# Patient Record
Sex: Male | Born: 1990
Health system: Southern US, Community
[De-identification: ages and names within clinical notes are randomized; demographics above are authoritative.]

## PROBLEM LIST (undated history)

## (undated) DIAGNOSIS — F988 Other specified behavioral and emotional disorders with onset usually occurring in childhood and adolescence: Secondary | ICD-10-CM

## (undated) DIAGNOSIS — C801 Malignant (primary) neoplasm, unspecified: Secondary | ICD-10-CM

## (undated) HISTORY — PX: HERNIA REPAIR: SHX51

---
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
## 2012-05-12 ENCOUNTER — Emergency Department (HOSPITAL_COMMUNITY)
Admission: EM | Admit: 2012-05-12 | Discharge: 2012-05-12 | Disposition: A | Discharge status: Home / Self Care | Payer: 59 | Attending: Emergency Medicine | Admitting: Emergency Medicine

## 2012-05-12 ENCOUNTER — Encounter (HOSPITAL_COMMUNITY): Payer: Self-pay | Admitting: *Deleted

## 2012-05-12 DIAGNOSIS — F17200 Nicotine dependence, unspecified, uncomplicated: Secondary | ICD-10-CM | POA: Insufficient documentation

## 2012-05-12 DIAGNOSIS — R51 Headache: Secondary | ICD-10-CM | POA: Insufficient documentation

## 2012-05-12 DIAGNOSIS — Z8659 Personal history of other mental and behavioral disorders: Secondary | ICD-10-CM | POA: Insufficient documentation

## 2012-05-12 HISTORY — DX: Other specified behavioral and emotional disorders with onset usually occurring in childhood and adolescence: F98.8

## 2012-05-12 MED ORDER — CEPHALEXIN 250 MG PO CAPS: 250.0000 mg | ORAL_CAPSULE | Freq: Four times a day (QID) | ORAL | Status: DC

## 2012-05-12 NOTE — ED Provider Notes (Signed)
History     CSN: [id number]  Arrival date & time 05/12/12  1152   First MD Initiated Contact with Patient 05/12/12 1255      Chief Complaint  Patient presents with  . Headache    (Consider location/radiation/quality/duration/timing/severity/associated sxs/prior treatment) HPI Comments: Mild scalp soreness near crown for a couple days.  No trauma.  No fever or chills.  Patient is a 21 y.o. male presenting with headaches. The history is provided by the patient and the spouse. No language interpreter was used.  Headache  This is a new problem. The current episode started 2 days ago. The problem occurs constantly. The problem has not changed since onset.The headache is associated with nothing. The pain is mild. The pain does not radiate. Pertinent negatives include no fever, no nausea and no vomiting. He has tried nothing for the symptoms.    Past Medical History  Diagnosis Date  . ADD (attention deficit disorder)     History reviewed. No pertinent past surgical history.  No family history on file.  History  Substance Use Topics  . Smoking status: Current Every Day Smoker  . Smokeless tobacco: Not on file  . Alcohol Use: No      Review of Systems  Constitutional: Negative for fever.  Gastrointestinal: Negative for nausea and vomiting.  Neurological: Negative for headaches.  Hematological: Negative for adenopathy.  All other systems reviewed and are negative.    Allergies  Review of patient's allergies indicates no known allergies.  Home Medications   Current Outpatient Rx  Name Route Sig Dispense Refill  . CEPHALEXIN 250 MG PO CAPS Oral Take 1 capsule (250 mg total) by mouth 4 (four) times daily. 28 capsule 0    BP 129/62  Pulse 69  Temp 97.4 F (36.3 C) (Oral)  Resp 16  Ht 6' (1.829 m)  Wt 180 lb (81.647 kg)  BMI 24.41 kg/m2  SpO2 100%  Physical Exam  Nursing note and vitals reviewed. Constitutional: He is oriented to person, place, and time. He  appears well-developed and well-nourished.  HENT:  Head: Normocephalic and atraumatic.    Eyes: EOM are normal.  Neck: Normal range of motion.  Cardiovascular: Normal rate, regular rhythm and intact distal pulses.   Pulmonary/Chest: Effort normal. No respiratory distress.  Abdominal: Soft. He exhibits no distension. There is no tenderness.  Musculoskeletal: Normal range of motion.  Neurological: He is alert and oriented to person, place, and time.  Skin: Skin is warm and dry.  Psychiatric: He has a normal mood and affect. Judgment normal.    ED Course  Procedures (including critical care time)  Labs Reviewed - No data to display No results found.   1. Scalp pain       MDM  rx-keflex 500 mg QID, 27 Warm compresses. F/u with PCP prn.        Evalina Field, [place] 05/12/12 1319

## 2012-05-12 NOTE — ED Notes (Signed)
Intermittent HA pain originating from small papule on top of head.  Sharp pain 8/10 that lasts a few seconds but comes back again about 20 mins. Later.  Has not taken any OTC or Rx pain meds.

## 2012-05-12 NOTE — ED Notes (Signed)
Reports knot on head has been causing sharp pains x 4-5 days.

## 2012-05-12 NOTE — ED Provider Notes (Signed)
Medical screening examination/treatment/procedure(s) were performed by non-physician practitioner and as supervising physician I was immediately available for consultation/collaboration.  Evellyn Tuff, MD 05/12/12 1511 

## 2012-05-12 NOTE — ED Notes (Addendum)
Patient with no complaints at this time. Respirations even and unlabored. Skin warm/dry. Discharge instructions reviewed with patient at this time. Patient given opportunity to voice concerns/ask questions. Patient discharged at this time and left Emergency Department with steady gait.   

## 2013-09-10 ENCOUNTER — Emergency Department (HOSPITAL_COMMUNITY): Payer: Worker's Compensation

## 2013-09-10 ENCOUNTER — Encounter (HOSPITAL_COMMUNITY): Payer: Self-pay | Admitting: Emergency Medicine

## 2013-09-10 ENCOUNTER — Emergency Department (HOSPITAL_COMMUNITY)
Admission: EM | Admit: 2013-09-10 | Discharge: 2013-09-10 | Disposition: A | Discharge status: Home / Self Care | Payer: Worker's Compensation | Attending: Emergency Medicine | Admitting: Emergency Medicine

## 2013-09-10 DIAGNOSIS — S79912A Unspecified injury of left hip, initial encounter: Secondary | ICD-10-CM

## 2013-09-10 DIAGNOSIS — Z87891 Personal history of nicotine dependence: Secondary | ICD-10-CM | POA: Insufficient documentation

## 2013-09-10 DIAGNOSIS — Y9289 Other specified places as the place of occurrence of the external cause: Secondary | ICD-10-CM | POA: Insufficient documentation

## 2013-09-10 DIAGNOSIS — Y9389 Activity, other specified: Secondary | ICD-10-CM | POA: Insufficient documentation

## 2013-09-10 DIAGNOSIS — S79919A Unspecified injury of unspecified hip, initial encounter: Secondary | ICD-10-CM | POA: Insufficient documentation

## 2013-09-10 DIAGNOSIS — S79929A Unspecified injury of unspecified thigh, initial encounter: Principal | ICD-10-CM

## 2013-09-10 DIAGNOSIS — W230XXA Caught, crushed, jammed, or pinched between moving objects, initial encounter: Secondary | ICD-10-CM | POA: Insufficient documentation

## 2013-09-10 DIAGNOSIS — Z8659 Personal history of other mental and behavioral disorders: Secondary | ICD-10-CM | POA: Insufficient documentation

## 2013-09-10 MED ORDER — NAPROXEN 500 MG PO TABS: 500.0000 mg | ORAL_TABLET | Freq: Two times a day (BID) | ORAL | Status: DC

## 2013-09-10 MED ORDER — CYCLOBENZAPRINE HCL 10 MG PO TABS: 10.0000 mg | ORAL_TABLET | Freq: Two times a day (BID) | ORAL | Status: DC | PRN

## 2013-09-10 MED ORDER — HYDROCODONE-ACETAMINOPHEN 5-325 MG PO TABS: 1.0000 | ORAL_TABLET | ORAL | Status: DC | PRN

## 2013-09-10 NOTE — ED Notes (Signed)
Patient transported to CT 

## 2013-09-10 NOTE — ED Notes (Signed)
Pt is here for left hip pain.  Pt states that his foot slipped and got caught between 2 conveyor belts and is leg was pulled away.  Pt now has left hip pain.  No numbness.  This occurred at 1am

## 2013-09-10 NOTE — ED Provider Notes (Signed)
CSN: [id number]     Arrival date & time 09/10/13  0801 History   First MD Initiated Contact with Patient 09/10/13 [phone]     Chief Complaint  Patient presents with  . Hip Pain     (Consider location/radiation/quality/duration/timing/severity/associated sxs/prior Treatment) Patient is a 23 y.o. male presenting with hip pain. The history is provided by the patient.  Hip Pain This is a new problem. The current episode started today. The problem occurs constantly. The problem has been unchanged. The symptoms are aggravated by standing and walking. He has tried nothing for the symptoms.   Terry Huang is a 23 y.o. male who presents to the ED with left hip pain. The pain started approximately 1:30 am when he was working. He stepped onto a work area and his foot got caught between two conveyor belts . He felt his foot twist and felt a pop in his hip. He was holding on to  a bar over his head so he did not fall. A coworker came and helped get his foot out and when he tried to put weight on the foot he felt another pop in his hip and more pain. He denies any other injuries. No nausea, vomiting or other problems.   Past Medical History  Diagnosis Date  . ADD (attention deficit disorder)    History reviewed. No pertinent past surgical history. No family history on file. History  Substance Use Topics  . Smoking status: Former Research scientist (life sciences)  . Smokeless tobacco: Not on file  . Alcohol Use: No    Review of Systems Negative except as stated in HPI   Allergies  Review of patient's allergies indicates no known allergies.  Home Medications   Current Outpatient Rx  Name  Route  Sig  Dispense  Refill  . cephALEXin (KEFLEX) 250 MG capsule   Oral   Take 1 capsule (250 mg total) by mouth 4 (four) times daily.   28 capsule   0    BP 136/77  Pulse 69  Temp(Src) 98.6 F (37 C) (Oral)  Resp 18  SpO2 99% Physical Exam  Nursing note and vitals reviewed. Constitutional: He is oriented to person,  place, and time. He appears well-developed and well-nourished. No distress.  HENT:  Head: Normocephalic and atraumatic.  Eyes: EOM are normal.  Neck: Neck supple.  Cardiovascular: Normal rate and regular rhythm.   Pulmonary/Chest: Effort normal.  Abdominal: Soft. Bowel sounds are normal. There is no tenderness.  Musculoskeletal:       Left hip: He exhibits tenderness.  Pain with flexion of the left hip. Pedal pulses equal, adequate circulation, good touch sensation. Pain with palpation of the lateral aspect of the left hip. Pain with weight bearing.  Neurological: He is alert and oriented to person, place, and time. He has normal strength. No cranial nerve deficit or sensory deficit. Abnormal gait: due to pain.  Skin: Skin is warm and dry.  Psychiatric: He has a normal mood and affect. His behavior is normal.   Ct Hip Left Wo Contrast  09/10/2013   CLINICAL DATA:  Left hip discomfort status post trauma  EXAM: CT OF THE LEFT HIP WITHOUT CONTRAST  TECHNIQUE: Multidetector CT imaging was performed according to the standard protocol. Multiplanar CT image reconstructions were also generated.  COMPARISON:  None.  FINDINGS: The hip joint space is preserved. The femoral head and neck and intertrochanteric regions appear intact. The observed portions of the left hemipelvis also appear normal. The surrounding soft tissues exhibit  no abnormal densities or findings to suggest a hematoma.  IMPRESSION: There is no acute bony abnormality of the left hip. The joint space is preserved. No soft tissue abnormalities are demonstrated either.   Electronically Signed   By: David  Martinique   On: 09/10/2013 09:13    ED Course  Procedures   MDM  23 y.o. male with left hip pain s/p injury at work. Will treat with pain medication and NSAIDS. He will follow up with ortho. I have reviewed this patient's vital signs, nurses notes, appropriate labs and imaging.  I have discussed findings and plan of care with the patient and  his family. They voice understanding.    Medication List    TAKE these medications       cyclobenzaprine 10 MG tablet  Commonly known as:  FLEXERIL  Take 1 tablet (10 mg total) by mouth 2 (two) times daily as needed for muscle spasms.     HYDROcodone-acetaminophen 5-325 MG per tablet  Commonly known as:  NORCO/VICODIN  Take 1 tablet by mouth every 4 (four) hours as needed.     naproxen 500 MG tablet  Commonly known as:  NAPROSYN  Take 1 tablet (500 mg total) by mouth 2 (two) times daily.      ASK your doctor about these medications       cephALEXin 250 MG capsule  Commonly known as:  KEFLEX  Take 1 capsule (250 mg total) by mouth 4 (four) times daily.         [place], [place] 09/10/13 [phone]

## 2013-09-10 NOTE — Discharge Instructions (Signed)
Call Dr. Ruthe Mannan office for a follow up appointment. Apply ice to the area. Do not take the pain medications if you are driving because they will make you sleepy. Return here as needed.

## 2013-09-12 NOTE — ED Provider Notes (Signed)
Medical screening examination/treatment/procedure(s) were conducted as a shared visit with non-physician practitioner(s) and myself.  I personally evaluated the patient during the encounter.  EKG Interpretation   None      Left hip pain after hip abduction at work. Neurovascular intact. Pain with range of motion. CT scan shows no acute anomalies. Refer to orthopedics  Nat Christen, MD 09/12/13 2034

## 2015-10-05 ENCOUNTER — Ambulatory Visit (HOSPITAL_COMMUNITY)
Admission: RE | Admit: 2015-10-05 | Discharge: 2015-10-05 | Disposition: A | Discharge status: Home / Self Care | Payer: 59 | Source: Ambulatory Visit | Attending: Family Medicine | Admitting: Family Medicine

## 2015-10-05 ENCOUNTER — Ambulatory Visit (INDEPENDENT_AMBULATORY_CARE_PROVIDER_SITE_OTHER): Payer: 59 | Admitting: Family Medicine

## 2015-10-05 ENCOUNTER — Telehealth: Payer: Self-pay | Admitting: Family Medicine

## 2015-10-05 ENCOUNTER — Encounter: Payer: Self-pay | Admitting: Family Medicine

## 2015-10-05 VITALS — BP 132/88 | Temp 99.0°F | Ht 71.0 in | Wt 142.0 lb

## 2015-10-05 DIAGNOSIS — R079 Chest pain, unspecified: Secondary | ICD-10-CM | POA: Diagnosis not present

## 2015-10-05 DIAGNOSIS — J111 Influenza due to unidentified influenza virus with other respiratory manifestations: Secondary | ICD-10-CM | POA: Diagnosis not present

## 2015-10-05 MED ORDER — ONDANSETRON 4 MG PO TBDP: 4.0000 mg | ORAL_TABLET | Freq: Three times a day (TID) | ORAL | Status: DC | PRN

## 2015-10-05 MED ORDER — NABUMETONE 500 MG PO TABS
500.0000 mg | ORAL_TABLET | Freq: Two times a day (BID) | ORAL | Status: DC
Start: 2015-10-05 — End: 2016-10-31

## 2015-10-05 MED ORDER — OSELTAMIVIR PHOSPHATE 75 MG PO CAPS: 75.0000 mg | ORAL_CAPSULE | Freq: Two times a day (BID) | ORAL | Status: DC

## 2015-10-05 NOTE — Telephone Encounter (Signed)
Results discussed with patient. Meds sent electronically to pharmacy. Patient verbalized understanding.

## 2015-10-05 NOTE — Telephone Encounter (Signed)
Pt thought he was getting 2-3 scripts, one for inflammation and one for nausea but unsure of exactly what He was supposed to be getting seen today around 2 pm please send them to [facility]

## 2015-10-05 NOTE — Progress Notes (Signed)
   Subjective:    Patient ID: Terry Huang, male    DOB: 01-26-1991, 25 y.o.   MRN: [id number]  Abdominal Pain This is a new problem. The current episode started yesterday. Associated symptoms include diarrhea, a fever, headaches and vomiting. Associated symptoms comments: Sore throat.   Chest and rib pain. Started 2 weeks ago.   Right ribs and left side of chest sore and tend and painful inclu deep breath  Some cough quite a bit  tmax 101.2 , headache whole head aching  Very dizy and nauseated dinm energy  Patient notes chest pain. Right-sided. Sharp in nature. Worse with a deep breath.  Challenges patient has had a prodrome of sorts. A couple weeks worth his symptomatology. Next  Also is had this acute symptomatology develop in the last couple days. Substantial nausea. Also vomiting. Also cough and substantial fever  Review of Systems  Constitutional: Positive for fever.  Gastrointestinal: Positive for vomiting, abdominal pain and diarrhea.  Neurological: Positive for headaches.       Objective:   Physical Exam Alert moderate malaise vital stable positive fever lungs no crackles no wheezes no chest wall pain heart rare rhythm abdomen good bowel sounds no discrete tenderness       Assessment & Plan:  Impression cough fever pleuritic pain acute onset of much along with subacute sent for further assessment chest x-ray was negative therefore felt to be a viral prodrome now followed by acute flu rationale discussed with patient medications prescribed for inflammation flu and nausea 25 minutes spent most in discussion of this somewhat cognitive presentation [facility]

## 2015-10-05 NOTE — Telephone Encounter (Signed)
Xray fine  relafen 500 bid 14 d  tamiflu 75 bid five d  zofr 4 odt 12 one qsix hrs prn

## 2016-10-29 ENCOUNTER — Encounter (HOSPITAL_COMMUNITY): Payer: Self-pay | Admitting: *Deleted

## 2016-10-29 ENCOUNTER — Emergency Department (HOSPITAL_COMMUNITY): Payer: 59

## 2016-10-29 ENCOUNTER — Emergency Department (HOSPITAL_COMMUNITY)
Admission: EM | Admit: 2016-10-29 | Discharge: 2016-10-29 | Disposition: A | Discharge status: Home / Self Care | Payer: 59 | Attending: Emergency Medicine | Admitting: Emergency Medicine

## 2016-10-29 DIAGNOSIS — Y999 Unspecified external cause status: Secondary | ICD-10-CM | POA: Diagnosis not present

## 2016-10-29 DIAGNOSIS — M25511 Pain in right shoulder: Secondary | ICD-10-CM | POA: Diagnosis not present

## 2016-10-29 DIAGNOSIS — Y929 Unspecified place or not applicable: Secondary | ICD-10-CM | POA: Insufficient documentation

## 2016-10-29 DIAGNOSIS — Y939 Activity, unspecified: Secondary | ICD-10-CM | POA: Diagnosis not present

## 2016-10-29 DIAGNOSIS — S42021A Displaced fracture of shaft of right clavicle, initial encounter for closed fracture: Secondary | ICD-10-CM | POA: Insufficient documentation

## 2016-10-29 DIAGNOSIS — S42001A Fracture of unspecified part of right clavicle, initial encounter for closed fracture: Secondary | ICD-10-CM

## 2016-10-29 DIAGNOSIS — Z87891 Personal history of nicotine dependence: Secondary | ICD-10-CM | POA: Insufficient documentation

## 2016-10-29 DIAGNOSIS — S4991XA Unspecified injury of right shoulder and upper arm, initial encounter: Secondary | ICD-10-CM | POA: Diagnosis present

## 2016-10-29 MED ORDER — TRAMADOL HCL 50 MG PO TABS: 50.0000 mg | ORAL_TABLET | Freq: Four times a day (QID) | ORAL | 0 refills | Status: DC | PRN

## 2016-10-29 MED ORDER — ONDANSETRON 4 MG PO TBDP: 4.0000 mg | ORAL_TABLET | Freq: Once | ORAL | Status: DC
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
## 2016-10-29 NOTE — Discharge Instructions (Signed)
Follow up with dr. Harrison next week. °
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
## 2016-10-31 ENCOUNTER — Ambulatory Visit (INDEPENDENT_AMBULATORY_CARE_PROVIDER_SITE_OTHER): Payer: 59 | Admitting: Orthopedic Surgery

## 2016-10-31 ENCOUNTER — Encounter: Payer: Self-pay | Admitting: Orthopedic Surgery

## 2016-10-31 VITALS — BP 122/80 | HR 58 | Ht 71.0 in | Wt 146.0 lb

## 2016-10-31 DIAGNOSIS — S42021A Displaced fracture of shaft of right clavicle, initial encounter for closed fracture: Secondary | ICD-10-CM

## 2016-10-31 MED ORDER — ACETAMINOPHEN-CODEINE #3 300-30 MG PO TABS: 1.0000 | ORAL_TABLET | Freq: Four times a day (QID) | ORAL | 0 refills | Status: DC | PRN

## 2016-10-31 MED ORDER — IBUPROFEN 800 MG PO TABS: 800.0000 mg | ORAL_TABLET | Freq: Three times a day (TID) | ORAL | 1 refills | Status: DC | PRN

## 2016-10-31 NOTE — Patient Instructions (Signed)
Sling x 2 weeks then fig 8 splint   No work for 6 weeks
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
## 2016-11-07 ENCOUNTER — Encounter: Payer: Self-pay | Admitting: Orthopedic Surgery

## 2016-11-07 ENCOUNTER — Telehealth: Payer: Self-pay | Admitting: Orthopedic Surgery

## 2016-11-07 NOTE — Telephone Encounter (Signed)
Note issued accordingly. 

## 2016-11-07 NOTE — Telephone Encounter (Signed)
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  Please advise.

## 2016-11-07 NOTE — Telephone Encounter (Signed)
WHEN HE IS READY

## 2016-11-18 ENCOUNTER — Encounter: Payer: Self-pay | Admitting: Orthopedic Surgery

## 2016-11-18 ENCOUNTER — Ambulatory Visit (INDEPENDENT_AMBULATORY_CARE_PROVIDER_SITE_OTHER): Payer: Self-pay | Admitting: Orthopedic Surgery

## 2016-11-18 DIAGNOSIS — S42021D Displaced fracture of shaft of right clavicle, subsequent encounter for fracture with routine healing: Secondary | ICD-10-CM

## 2016-11-18 NOTE — Progress Notes (Signed)
Fracture care follow-up  Encounter Diagnosis  Name Primary?  . Closed displaced fracture of shaft of right clavicle with routine healing, subsequent encounter Yes    Chief Complaint  Patient presents with  . Follow-up    Recheck on right clavicle fracture. DOI 10-29-16.    The patient wanted his right clavicle fracture checked again he said he felt a rough spot under his skin  On examination he is neurovascularly intact. He has no skin tenting. The fracture site is nontender. Between the fracture and was there is some roughening under the skin which is actually into of the fracture site  Recommend continue current treatment and follow-up for x-rays as previously ordered

## 2016-11-18 NOTE — Patient Instructions (Signed)
KEEP PREVIOUS APPT

## 2016-11-28 ENCOUNTER — Ambulatory Visit: Payer: Self-pay | Admitting: Orthopedic Surgery

## 2016-12-01 ENCOUNTER — Other Ambulatory Visit: Payer: Self-pay | Admitting: Radiology

## 2016-12-01 DIAGNOSIS — S42021D Displaced fracture of shaft of right clavicle, subsequent encounter for fracture with routine healing: Secondary | ICD-10-CM

## 2016-12-02 ENCOUNTER — Ambulatory Visit (INDEPENDENT_AMBULATORY_CARE_PROVIDER_SITE_OTHER): Payer: 59 | Admitting: Orthopedic Surgery

## 2016-12-02 ENCOUNTER — Ambulatory Visit (INDEPENDENT_AMBULATORY_CARE_PROVIDER_SITE_OTHER): Payer: 59

## 2016-12-02 ENCOUNTER — Encounter: Payer: Self-pay | Admitting: Orthopedic Surgery

## 2016-12-02 DIAGNOSIS — S42021D Displaced fracture of shaft of right clavicle, subsequent encounter for fracture with routine healing: Secondary | ICD-10-CM | POA: Diagnosis not present

## 2016-12-02 DIAGNOSIS — S42021A Displaced fracture of shaft of right clavicle, initial encounter for closed fracture: Secondary | ICD-10-CM

## 2016-12-02 MED ORDER — IBUPROFEN 800 MG PO TABS: 800.0000 mg | ORAL_TABLET | Freq: Three times a day (TID) | ORAL | 1 refills | Status: DC | PRN

## 2016-12-02 NOTE — Progress Notes (Signed)
Fracture care follow-up right clavicle fracture  Chief Complaint  Patient presents with  . Follow-up    4 wek recheck on right clavicle fracture, DOI 10-29-16.   5 weeks post injury   Encounter Diagnosis  Name Primary?  . Closed displaced fracture of shaft of right clavicle with routine healing, subsequent encounter Yes   Meds ordered this encounter  Medications  . ibuprofen (ADVIL,MOTRIN) 800 MG tablet    Sig: Take 1 tablet (800 mg total) by mouth every 8 (eight) hours as needed.    Dispense:  90 tablet    Refill:  1     X-ray shows apex dorsal angulation less than 2 cm of shortening and bone contact.  Patient will continue ibuprofen and follow-up for x-rays in

## 2017-01-13 ENCOUNTER — Ambulatory Visit: Payer: 59

## 2017-01-13 ENCOUNTER — Encounter: Payer: Self-pay | Admitting: Orthopedic Surgery

## 2017-01-13 ENCOUNTER — Ambulatory Visit (INDEPENDENT_AMBULATORY_CARE_PROVIDER_SITE_OTHER): Payer: 59

## 2017-01-13 ENCOUNTER — Ambulatory Visit (INDEPENDENT_AMBULATORY_CARE_PROVIDER_SITE_OTHER): Payer: Self-pay | Admitting: Orthopedic Surgery

## 2017-01-13 VITALS — BP 106/70 | HR 66 | Ht 71.0 in | Wt 144.0 lb

## 2017-01-13 DIAGNOSIS — S42021D Displaced fracture of shaft of right clavicle, subsequent encounter for fracture with routine healing: Secondary | ICD-10-CM

## 2017-01-13 NOTE — Progress Notes (Signed)
Follow-up fracture care  Chief Complaint  Patient presents with  . Follow-up    Rt clavicle fracture DOI 10/29/16    11 week recheck    Patient has a right clavicle fracture  We finally see some bridging callus starting to form  He is really asymptomatic  He has normal passive motion without pain  X-ray in 4 weeks continue current work restrictions

## 2017-01-13 NOTE — Patient Instructions (Signed)
Continue work restrictions 4 weeks

## 2017-02-10 ENCOUNTER — Ambulatory Visit (INDEPENDENT_AMBULATORY_CARE_PROVIDER_SITE_OTHER): Payer: 59 | Admitting: Orthopedic Surgery

## 2017-02-10 ENCOUNTER — Ambulatory Visit (INDEPENDENT_AMBULATORY_CARE_PROVIDER_SITE_OTHER): Payer: 59

## 2017-02-10 ENCOUNTER — Encounter: Payer: Self-pay | Admitting: Orthopedic Surgery

## 2017-02-10 DIAGNOSIS — S42021D Displaced fracture of shaft of right clavicle, subsequent encounter for fracture with routine healing: Secondary | ICD-10-CM

## 2017-02-10 NOTE — Progress Notes (Signed)
Fracture care follow-up  Routine office visit  Encounter Diagnosis  Name Primary?  . Closed displaced fracture of shaft of right clavicle with routine healing, subsequent encounter Yes    Large amount of callus over the superior fracture fragment. 37 mm of shortening.  Clinical exam large deformity over the midshaft clavicle no tenderness patient has full range of motion and equal strength in both arms  He can resume full duty no restrictions at work  Released

## 2017-02-10 NOTE — Patient Instructions (Signed)
Note for return to work full duty no restrictions

## 2017-10-23 ENCOUNTER — Encounter: Payer: Self-pay | Admitting: Family Medicine

## 2017-10-23 ENCOUNTER — Ambulatory Visit (INDEPENDENT_AMBULATORY_CARE_PROVIDER_SITE_OTHER): Payer: 59 | Admitting: Family Medicine

## 2017-10-23 VITALS — BP 122/80 | Temp 102.7°F | Ht 71.0 in | Wt 152.0 lb

## 2017-10-23 DIAGNOSIS — J039 Acute tonsillitis, unspecified: Secondary | ICD-10-CM | POA: Diagnosis not present

## 2017-10-23 DIAGNOSIS — I889 Nonspecific lymphadenitis, unspecified: Secondary | ICD-10-CM

## 2017-10-23 MED ORDER — DOXYCYCLINE HYCLATE 100 MG PO TABS: 100.0000 mg | ORAL_TABLET | Freq: Two times a day (BID) | ORAL | 0 refills | Status: DC

## 2017-10-23 NOTE — Progress Notes (Signed)
   Subjective:    Patient ID: Terry Huang, male    DOB: 05-30-1991, 27 y.o.   MRN: [id number]   Sinusitis   This is a new problem. Episode onset: 2 days.  Associated symptoms include coughing, headaches and a sore throat. ( Fever, diarrhea)    No results found for this or any previous visit.   pos throat pain severe in ature  Some cough and ocng    headahe and dizy   Tender sensitibr  Pos sixknes in family    Some mild achiness in joints and muscles     energy not good   apetitie not good   Sat felt achey and hot thn normal     Review of Systems  HENT: Positive for sore throat.   Respiratory: Positive for cough.   Neurological: Positive for headaches.       Objective:   Physical Exam  aAlert active good hydration TMs good pharynx erythematous exact native tonsils exudative, left anterior cervical lymphadenitis lungs clear heart regular rate and rhythm      Assessment & Plan:  Impression purulent tonsillitis with cervical lymphadenitis plan add ibuprofen local measures discussed antibiotics prescribed symptom care discussed

## 2019-01-04 ENCOUNTER — Other Ambulatory Visit: Payer: Self-pay

## 2019-01-04 DIAGNOSIS — Z20822 Contact with and (suspected) exposure to covid-19: Secondary | ICD-10-CM

## 2019-01-06 LAB — NOVEL CORONAVIRUS, NAA: SARS-CoV-2, NAA: NOT DETECTED
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
## 2019-07-23 ENCOUNTER — Ambulatory Visit: Payer: Self-pay

## 2019-07-23 ENCOUNTER — Other Ambulatory Visit: Payer: Self-pay

## 2019-07-23 ENCOUNTER — Ambulatory Visit: Payer: Self-pay | Admitting: Orthopaedic Surgery

## 2019-07-23 ENCOUNTER — Encounter: Payer: Self-pay | Admitting: Orthopaedic Surgery

## 2019-07-23 VITALS — Temp 97.1°F | Ht 71.0 in | Wt 161.0 lb

## 2019-07-23 DIAGNOSIS — M25531 Pain in right wrist: Secondary | ICD-10-CM

## 2019-07-23 DIAGNOSIS — S52501A Unspecified fracture of the lower end of right radius, initial encounter for closed fracture: Secondary | ICD-10-CM

## 2019-07-23 DIAGNOSIS — W19XXXA Unspecified fall, initial encounter: Secondary | ICD-10-CM

## 2019-07-23 NOTE — Progress Notes (Signed)
Patient [id number] Terry Huang, male DOB:[birth year], 29 y.o. [id number]  Chief Complaint  Patient presents with  . Wrist Injury    Right wrist fracture.    HPI  Terry Huang is a 29 y.o. male who fell on New Year's Day and hurt his right dominant wrist.  He has had pain since then.  He has used ice and an Ace Bandage.  It continues to hurt.  It is swollen. He has no redness or numbness.  He has pain with motion of the wrist.  He has no other injury.   Body mass index is 22.45 kg/m.  ROS  Review of Systems  Constitutional: Positive for activity change.  Musculoskeletal: Positive for arthralgias and joint swelling.  All other systems reviewed and are negative.   All other systems reviewed and are negative.  The following is a summary of the past history medically, past history surgically, known current medicines, social history and family history.  This information is gathered electronically by the computer from prior information and documentation.  I review this each visit and have found including this information at this point in the chart is beneficial and informative.    Past Medical History:  Diagnosis Date  . ADD (attention deficit disorder)     Past Surgical History:  Procedure Laterality Date  . HERNIA REPAIR      No family history on file.  Social History Social History   Tobacco Use  . Smoking status: Former Research scientist (life sciences)  . Smokeless tobacco: Never Used  . Tobacco comment: pt smokes e-cigs  Substance Use Topics  . Alcohol use: Yes    Comment: socially   . Drug use: No    No Known Allergies  Current Outpatient Medications  Medication Sig Dispense Refill  . acetaminophen-codeine (TYLENOL #3) 300-30 MG tablet Take 1 tablet by mouth every 6 (six) hours as needed for moderate pain. (Patient not taking: Reported on 01/13/2017) 30 tablet 0  . doxycycline (VIBRA-TABS) 100 MG tablet Take 1 tablet (100 mg total) by mouth 2 (two) times daily. 20 tablet 0  .  ibuprofen (ADVIL,MOTRIN) 800 MG tablet Take 1 tablet (800 mg total) by mouth every 8 (eight) hours as needed. (Patient not taking: Reported on 10/23/2017) 90 tablet 1  . traMADol (ULTRAM) 50 MG tablet Take 1 tablet (50 mg total) by mouth every 6 (six) hours as needed. (Patient not taking: Reported on 01/13/2017) 20 tablet 0   No current facility-administered medications for this visit.     Physical Exam  Temperature (!) 97.1 F (36.2 C), height 5\' 11"  (1.803 m), weight 161 lb (73 kg).  Constitutional: overall normal hygiene, normal nutrition, well developed, normal grooming, normal body habitus. Assistive device:Ace bandage right wrist  Musculoskeletal: gait and station Limp none, muscle tone and strength are normal, no tremors or atrophy is present.  .  Neurological: coordination overall normal.  Deep tendon reflex/nerve stretch intact.  Sensation normal.  Cranial nerves II-XII intact.   Skin:   Normal overall no scars, lesions, ulcers or rashes. No psoriasis.  Psychiatric: Alert and oriented x 3.  Recent memory intact, remote memory unclear.  Normal mood and affect. Well groomed.  Good eye contact.  Cardiovascular: overall no swelling, no varicosities, no edema bilaterally, normal temperatures of the legs and arms, no clubbing, cyanosis and good capillary refill.  Right wrist is very tender, ROM limited secondary to pain, NV intact, more pain dorsal and radial. ROM fingers full but tender. ROM shoulder and neck is  full.  Lymphatic: palpation is normal.  All other systems reviewed and are negative   The patient has been educated about the nature of the problem(s) and counseled on treatment options.  The patient appeared to understand what I have discussed and is in agreement with it.  X-rays were done of the right wrist, reported separately.  Encounter Diagnoses  Name Primary?  . Pain in right wrist Yes  . Traumatic closed nondisplaced fracture of distal end of radius, right,  initial encounter     PLAN Call if any problems.  Precautions discussed.  Continue current medications.   Return to clinic 1 week   A long arm cast is to be applied.  X-rays on return in cast.  Call if any problem.  Precautions discussed.   Electronically Signed Sanjuana Kava, MD 1/5/20219:02 AM

## 2019-07-23 NOTE — Patient Instructions (Signed)
AT work:  No use right arm.  If they cannot accommodate, then no work.

## 2019-07-30 ENCOUNTER — Ambulatory Visit: Payer: Self-pay

## 2019-07-30 ENCOUNTER — Encounter: Payer: Self-pay | Admitting: Orthopaedic Surgery

## 2019-07-30 ENCOUNTER — Other Ambulatory Visit: Payer: Self-pay

## 2019-07-30 ENCOUNTER — Ambulatory Visit (INDEPENDENT_AMBULATORY_CARE_PROVIDER_SITE_OTHER): Payer: Self-pay | Admitting: Orthopaedic Surgery

## 2019-07-30 DIAGNOSIS — S52571D Other intraarticular fracture of lower end of right radius, subsequent encounter for closed fracture with routine healing: Secondary | ICD-10-CM

## 2019-07-30 DIAGNOSIS — S52591D Other fractures of lower end of right radius, subsequent encounter for closed fracture with routine healing: Secondary | ICD-10-CM

## 2019-07-30 NOTE — Progress Notes (Signed)
My wrist is OK  He has done well in the long arm cast this past week on the right arm.  NV intact. Cast is OK.  X-rays were done of the right wrist in cast, reported separately.  Encounter Diagnosis  Name Primary?  . Other closed intra-articular fracture of distal end of right radius with routine healing, subsequent encounter Yes   Return in one week.  X-rays out of cast of the right wrist.  Call if any problem.  Precautions discussed.   Electronically Signed Sanjuana Kava, MD 1/12/20219:00 AM

## 2019-08-06 ENCOUNTER — Ambulatory Visit (INDEPENDENT_AMBULATORY_CARE_PROVIDER_SITE_OTHER): Payer: Self-pay | Admitting: Orthopaedic Surgery

## 2019-08-06 ENCOUNTER — Other Ambulatory Visit: Payer: Self-pay

## 2019-08-06 ENCOUNTER — Encounter: Payer: Self-pay | Admitting: Orthopaedic Surgery

## 2019-08-06 ENCOUNTER — Ambulatory Visit: Payer: Self-pay

## 2019-08-06 DIAGNOSIS — S52571D Other intraarticular fracture of lower end of right radius, subsequent encounter for closed fracture with routine healing: Secondary | ICD-10-CM

## 2019-08-06 NOTE — Progress Notes (Signed)
My wrist is stiff  He has done well with the long arm cast.  It was removed. Skin is OK.  NV intact.  X-rays were done of the right wrist, reported separately.  Encounter Diagnosis  Name Primary?  . Other closed intra-articular fracture of distal end of right radius with routine healing, subsequent encounter Yes   A new short arm cast applied.  Return in three weeks.  X-rays then out of cast.  Call if any problem.  Precautions discussed.   Electronically Signed Sanjuana Kava, MD 1/19/20218:29 AM

## 2019-08-19 ENCOUNTER — Encounter: Payer: Self-pay | Admitting: Family Medicine

## 2019-08-27 ENCOUNTER — Other Ambulatory Visit: Payer: Self-pay

## 2019-08-27 ENCOUNTER — Encounter: Payer: Self-pay | Admitting: Orthopaedic Surgery

## 2019-08-27 ENCOUNTER — Ambulatory Visit (INDEPENDENT_AMBULATORY_CARE_PROVIDER_SITE_OTHER): Payer: Self-pay | Admitting: Orthopaedic Surgery

## 2019-08-27 ENCOUNTER — Ambulatory Visit: Payer: Self-pay

## 2019-08-27 DIAGNOSIS — S52571D Other intraarticular fracture of lower end of right radius, subsequent encounter for closed fracture with routine healing: Secondary | ICD-10-CM

## 2019-08-27 NOTE — Progress Notes (Signed)
My wrist is a little sore  He had his cast removed.  ROM is fair just out of the splint. NV intact.  X-rays were done of the right wrist, reported separately.  Encounter Diagnosis  Name Primary?  . Other closed intra-articular fracture of distal end of right radius with routine healing, subsequent encounter Yes   I have explained about an intra-articular fracture of the right wrist.  It is important he get back full motion.  I have gone over exercises for him to do at home.  Return in two weeks.  X-rays of the right wrist then.  Consider PT if his supination has not returned.  Call if any problem.  Precautions discussed.   Electronically Signed Sanjuana Kava, MD 2/9/20218:38 AM

## 2019-09-10 ENCOUNTER — Ambulatory Visit: Payer: Self-pay | Admitting: Orthopaedic Surgery

## 2019-09-12 ENCOUNTER — Ambulatory Visit: Payer: Self-pay

## 2019-09-12 ENCOUNTER — Encounter: Payer: Self-pay | Admitting: Orthopaedic Surgery

## 2019-09-12 ENCOUNTER — Ambulatory Visit (INDEPENDENT_AMBULATORY_CARE_PROVIDER_SITE_OTHER): Payer: Self-pay | Admitting: Orthopaedic Surgery

## 2019-09-12 ENCOUNTER — Other Ambulatory Visit: Payer: Self-pay

## 2019-09-12 DIAGNOSIS — S52571D Other intraarticular fracture of lower end of right radius, subsequent encounter for closed fracture with routine healing: Secondary | ICD-10-CM

## 2019-09-12 NOTE — Progress Notes (Signed)
I have no pain  He is doing well with the right wrist.  He has very good motion and no pain.  X-rays were done of the right wrist, reported separately.  Encounter Diagnosis  Name Primary?  . Other closed intra-articular fracture of distal end of right radius with routine healing, subsequent encounter Yes   Discharge.  Call if any problem.  Precautions discussed.   Electronically Signed Sanjuana Kava, MD 2/25/202110:31 AM

## 2019-10-06 ENCOUNTER — Encounter (HOSPITAL_COMMUNITY): Payer: Self-pay | Admitting: Emergency Medicine

## 2019-10-06 ENCOUNTER — Other Ambulatory Visit: Payer: Self-pay

## 2019-10-06 ENCOUNTER — Emergency Department (HOSPITAL_COMMUNITY)
Admission: EM | Admit: 2019-10-06 | Discharge: 2019-10-07 | Disposition: A | Discharge status: Other Acute Inpatient Hospital | Payer: Self-pay | Attending: Emergency Medicine | Admitting: Emergency Medicine

## 2019-10-06 DIAGNOSIS — Y929 Unspecified place or not applicable: Secondary | ICD-10-CM | POA: Insufficient documentation

## 2019-10-06 DIAGNOSIS — Y999 Unspecified external cause status: Secondary | ICD-10-CM | POA: Insufficient documentation

## 2019-10-06 DIAGNOSIS — Z7289 Other problems related to lifestyle: Secondary | ICD-10-CM

## 2019-10-06 DIAGNOSIS — Z87891 Personal history of nicotine dependence: Secondary | ICD-10-CM | POA: Insufficient documentation

## 2019-10-06 DIAGNOSIS — S51812A Laceration without foreign body of left forearm, initial encounter: Secondary | ICD-10-CM | POA: Insufficient documentation

## 2019-10-06 DIAGNOSIS — F329 Major depressive disorder, single episode, unspecified: Secondary | ICD-10-CM | POA: Insufficient documentation

## 2019-10-06 DIAGNOSIS — Z20822 Contact with and (suspected) exposure to covid-19: Secondary | ICD-10-CM | POA: Insufficient documentation

## 2019-10-06 DIAGNOSIS — Y939 Activity, unspecified: Secondary | ICD-10-CM | POA: Insufficient documentation

## 2019-10-06 DIAGNOSIS — R45851 Suicidal ideations: Secondary | ICD-10-CM | POA: Insufficient documentation

## 2019-10-06 DIAGNOSIS — F1010 Alcohol abuse, uncomplicated: Secondary | ICD-10-CM | POA: Insufficient documentation

## 2019-10-06 DIAGNOSIS — X781XXA Intentional self-harm by knife, initial encounter: Secondary | ICD-10-CM | POA: Insufficient documentation

## 2019-10-06 DIAGNOSIS — Z23 Encounter for immunization: Secondary | ICD-10-CM | POA: Insufficient documentation

## 2019-10-06 LAB — COMPREHENSIVE METABOLIC PANEL
ALT: 54 U/L — ABNORMAL HIGH (ref 0–44)
AST: 62 U/L — ABNORMAL HIGH (ref 15–41)
Albumin: 4.7 g/dL (ref 3.5–5.0)
Alkaline Phosphatase: 82 U/L (ref 38–126)
Anion gap: 13 (ref 5–15)
BUN: 6 mg/dL (ref 6–20)
CO2: 22 mmol/L (ref 22–32)
Calcium: 8.6 mg/dL — ABNORMAL LOW (ref 8.9–10.3)
Chloride: 100 mmol/L (ref 98–111)
Creatinine, Ser: 0.71 mg/dL (ref 0.61–1.24)
GFR calc Af Amer: >60 mL/min (ref >60)
GFR calc non Af Amer: >60 mL/min (ref >60)
Glucose, Bld: 102 mg/dL — ABNORMAL HIGH (ref 70–99)
Potassium: 3.3 mmol/L — ABNORMAL LOW (ref 3.5–5.1)
Sodium: 135 mmol/L (ref 135–145)
Total Bilirubin: 0.9 mg/dL (ref 0.3–1.2)
Total Protein: 8.3 g/dL — ABNORMAL HIGH (ref 6.5–8.1)

## 2019-10-06 LAB — CBC
HCT: 46.2 % (ref 39.0–52.0)
Hemoglobin: 16.4 g/dL (ref 13.0–17.0)
MCH: 32.7 pg (ref 26.0–34.0)
MCHC: 35.5 g/dL (ref 30.0–36.0)
MCV: 92.2 fL (ref 80.0–100.0)
Platelets: 383 10*3/uL (ref 150–400)
RBC: 5.01 MIL/uL (ref 4.22–5.81)
RDW: 11.3 % — ABNORMAL LOW (ref 11.5–15.5)
WBC: 7.3 10*3/uL (ref 4.0–10.5)
nRBC: 0 % (ref 0.0–0.2)

## 2019-10-06 LAB — RESPIRATORY PANEL BY RT PCR (FLU A&B, COVID)
Influenza A by PCR: NEGATIVE
Influenza B by PCR: NEGATIVE
SARS Coronavirus 2 by RT PCR: NEGATIVE

## 2019-10-06 LAB — RAPID URINE DRUG SCREEN, HOSP PERFORMED
Amphetamines: NOT DETECTED
Barbiturates: NOT DETECTED
Benzodiazepines: NOT DETECTED
Cocaine: NOT DETECTED
Opiates: NOT DETECTED
Tetrahydrocannabinol: NOT DETECTED

## 2019-10-06 LAB — ETHANOL: Alcohol, Ethyl (B): 356 mg/dL (ref <10)

## 2019-10-06 MED ORDER — ALUM & MAG HYDROXIDE-SIMETH 200-200-20 MG/5ML PO SUSP
30.0000 mL | Freq: Once | ORAL | Status: AC
  Administered 2019-10-06: 30 mL via ORAL
  Filled 2019-10-06: qty 30

## 2019-10-06 MED ORDER — TETANUS-DIPHTH-ACELL PERTUSSIS 5-2.5-18.5 LF-MCG/0.5 IM SUSP
0.5000 mL | Freq: Once | INTRAMUSCULAR | Status: AC
  Administered 2019-10-06: 0.5 mL via INTRAMUSCULAR
  Filled 2019-10-06: qty 0.5
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
## 2019-10-06 NOTE — Progress Notes (Signed)
Patient meets criteria for inpatient treatment per Ricky Ala, NP. No appropriate beds at [facility] currently. CSW faxed referrals to the following facilities for review:  [facility]   [facility]   [facility]  [facility]    TTS will continue to seek bed placement.     Darletta Moll MSW, [place] Worker Disposition  [facility] Ph: [phone] Fax: [phone] 10/06/2019 12:48 PM

## 2019-10-06 NOTE — ED Notes (Signed)
Pt informed of recommendation of inpatient placement, bed found at [facility] and can arrive after 4pm tomorrow. Pt verbalized understanding.

## 2019-10-06 NOTE — ED Notes (Signed)
[facility] called and states pt has been accepted to [facility] but can not arrive until 10/07/19 after 1600.

## 2019-10-06 NOTE — Consult Note (Signed)
Telepsych Consultation   Reason for Consult: Deliberate self cutting Referring Physician:  EPD Location of Patient:  [facility]   Location of Provider: [facility]  Patient Identification: Terry Huang MRN:  [id number] Principal Diagnosis: <principal problem not specified> Diagnosis:  Active Problems:   * No active hospital problems. *   Total Time spent with patient: 15 minutes  Subjective:   Terry Huang is a 29 y.o. male was seen via teleassessment.  He presents flat, guarded but pleasant.  Denying suicidal or homicidal ideations.  Denies auditory or visual hallucinations.  Denies previous history with mental illness.  As patient is very minimal throughout this assessment.  Chart reviewed BAL 356 on admission.  As he reports " I do not know" reports he resides by himself.  Denies any supportive person to follow-up with.  States his ex-girlfriend Terry Huang called 911 to have him evaluated.  Chart reviewed multiple stressors listed.  Will recommend inpatient admission.  Case staffed with attending psychiatrist [place].  Support,encouragement and  reassurance was provided.  HPI:  Terry Huang is an 29 y.o. male presenting voluntarily with self-harming behaviors. Patient denied SI, HI and psychosis. Patient reported being on the phone with his girlfriend, stating "I told her I was doing dumb shit and she called the police". Patient admitted to drinking and cutting his wrist. Patient reported the police came to his house and brought him in to the hospital. Patient denied suicide attempt, when asked why patient stated, "its just the mental state I was in, I do damage but not to the point where I want things to end". Patient reported this cutting incident was an isolated event. Patient reported stressors/triggers include, "life, bills, just life". Patient reported grief/loss over fathers death 4 years ago and that he does not talk about it and has not had outpatient grief loss  counseling. During assessment patient minimized incident.   Past Psychiatric History:   Risk to Self: Suicidal Ideation: No Suicidal Intent: (unable to assess) Is patient at risk for suicide?: No Suicidal Plan?: No Access to Means: No What has been your use of drugs/alcohol within the last 12 months?: (alcohol) How many times?: (0) Other Self Harm Risks: (cutting self) Triggers for Past Attempts: (denied) Intentional Self Injurious Behavior: Cutting Comment - Self Injurious Behavior: (cut arm) Risk to Others: Homicidal Ideation: No Thoughts of Harm to Others: No Current Homicidal Intent: No Current Homicidal Plan: No Access to Homicidal Means: No Identified Victim: (n/a) History of harm to others?: No Assessment of Violence: None Noted Violent Behavior Description: (none reported) Does patient have access to weapons?: Yes (Comment)(knives) Criminal Charges Pending?: No Does patient have a court date: No Prior Inpatient Therapy: Prior Inpatient Therapy: No Prior Outpatient Therapy: Prior Outpatient Therapy: No Does patient have an ACCT team?: No Does patient have Intensive In-House Services?  : No Does patient have [facility] services? : No Does patient have P4CC services?: No  Past Medical History:  Past Medical History:  Diagnosis Date  . ADD (attention deficit disorder)     Past Surgical History:  Procedure Laterality Date  . HERNIA REPAIR     Family History: No family history on file. Family Psychiatric  History:  Social History:  Social History   Substance and Sexual Activity  Alcohol Use Yes   Comment: socially      Social History   Substance and Sexual Activity  Drug Use No    Social History   Socioeconomic History  .  Marital status: Single    Spouse name: Not on file  . Number of children: Not on file  . Years of education: Not on file  . Highest education level: Not on file  Occupational History  . Not on file  Tobacco Use  . Smoking status:  Former Research scientist (life sciences)  . Smokeless tobacco: Never Used  . Tobacco comment: pt smokes e-cigs  Substance and Sexual Activity  . Alcohol use: Yes    Comment: socially   . Drug use: No  . Sexual activity: Not on file  Other Topics Concern  . Not on file  Social History Narrative  . Not on file   Social Determinants of Health   Financial Resource Strain:   . Difficulty of Paying Living Expenses:   Food Insecurity:   . Worried About Charity fundraiser in the Last Year:   . Arboriculturist in the Last Year:   Transportation Needs:   . Film/video editor (Medical):   Marland Kitchen Lack of Transportation (Non-Medical):   Physical Activity:   . Days of Exercise per Week:   . Minutes of Exercise per Session:   Stress:   . Feeling of Stress :   Social Connections:   . Frequency of Communication with Friends and Family:   . Frequency of Social Gatherings with Friends and Family:   . Attends Religious Services:   . Active Member of Clubs or Organizations:   . Attends Archivist Meetings:   Marland Kitchen Marital Status:    Additional Social History:    Allergies:  No Known Allergies  Labs:  Results for orders placed or performed during the hospital encounter of 10/06/19 (from the past 48 hour(s))  Rapid urine drug screen (hospital performed)     Status: None   Collection Time: 10/06/19  2:24 AM  Result Value Ref Range   Opiates NONE DETECTED NONE DETECTED   Cocaine NONE DETECTED NONE DETECTED   Benzodiazepines NONE DETECTED NONE DETECTED   Amphetamines NONE DETECTED NONE DETECTED   Tetrahydrocannabinol NONE DETECTED NONE DETECTED   Barbiturates NONE DETECTED NONE DETECTED    Comment: (NOTE) DRUG SCREEN FOR MEDICAL PURPOSES ONLY.  IF CONFIRMATION IS NEEDED FOR ANY PURPOSE, NOTIFY LAB WITHIN 5 DAYS. LOWEST DETECTABLE LIMITS FOR URINE DRUG SCREEN Drug Class                     Cutoff (ng/mL) Amphetamine and metabolites    1000 Barbiturate and metabolites    200 Benzodiazepine                  [id number] Tricyclics and metabolites     300 Opiates and metabolites        300 Cocaine and metabolites        300 THC                            50 Performed at [facility], [address] [place], [place] [address]   Respiratory Panel by RT PCR (Flu A&B, Covid) - Nasopharyngeal Swab     Status: None   Collection Time: 10/06/19  2:24 AM   Specimen: Nasopharyngeal Swab  Result Value Ref Range   SARS Coronavirus 2 by RT PCR NEGATIVE NEGATIVE    Comment: (NOTE) SARS-CoV-2 target nucleic acids are NOT DETECTED. The SARS-CoV-2 RNA is generally detectable in upper respiratoy specimens during the acute phase of infection. The lowest concentration of SARS-CoV-2  viral copies this assay can detect is 131 copies/mL. A negative result does not preclude SARS-Cov-2 infection and should not be used as the sole basis for treatment or other patient management decisions. A negative result may occur with  improper specimen collection/handling, submission of specimen other than nasopharyngeal swab, presence of viral mutation(s) within the areas targeted by this assay, and inadequate number of viral copies (<131 copies/mL). A negative result must be combined with clinical observations, patient history, and epidemiological information. The expected result is Negative. Fact Sheet for Patients:  PinkCheek.be Fact Sheet for Healthcare Providers:  GravelBags.it This test is not yet ap proved or cleared by the [place] FDA and  has been authorized for detection and/or diagnosis of SARS-CoV-2 by FDA under an Emergency Use Authorization (EUA). This EUA will remain  in effect (meaning this test can be used) for the duration of the COVID-19 declaration under Section 564(b)(1) of the Act, 21 U.S.C. section 360bbb-3(b)(1), unless the authorization is terminated or revoked sooner.    Influenza A by PCR NEGATIVE NEGATIVE   Influenza B by PCR NEGATIVE  NEGATIVE    Comment: (NOTE) The Xpert Xpress SARS-CoV-2/FLU/RSV assay is intended as an aid in  the diagnosis of influenza from Nasopharyngeal swab specimens and  should not be used as a sole basis for treatment. Nasal washings and  aspirates are unacceptable for Xpert Xpress SARS-CoV-2/FLU/RSV  testing. Fact Sheet for Patients: PinkCheek.be Fact Sheet for Healthcare Providers: GravelBags.it This test is not yet approved or cleared by the [place] FDA and  has been authorized for detection and/or diagnosis of SARS-CoV-2 by  FDA under an Emergency Use Authorization (EUA). This EUA will remain  in effect (meaning this test can be used) for the duration of the  Covid-19 declaration under Section 564(b)(1) of the Act, 21  U.S.C. section 360bbb-3(b)(1), unless the authorization is  terminated or revoked. Performed at [facility], [address], [place], [place] [address]   CBC     Status: Abnormal   Collection Time: 10/06/19  2:28 AM  Result Value Ref Range   WBC 7.3 4.0 - 10.5 K/uL   RBC 5.01 4.22 - 5.81 MIL/uL   Hemoglobin 16.4 13.0 - 17.0 g/dL   HCT 46.2 39.0 - 52.0 %   MCV 92.2 80.0 - 100.0 fL   MCH 32.7 26.0 - 34.0 pg   MCHC 35.5 30.0 - 36.0 g/dL   RDW 11.3 (L) 11.5 - 15.5 %   Platelets 383 150 - 400 K/uL   nRBC 0.0 0.0 - 0.2 %    Comment: Performed at [facility] [place], [address], [place], [place] [address]  Comprehensive metabolic panel     Status: Abnormal   Collection Time: 10/06/19  2:28 AM  Result Value Ref Range   Sodium 135 135 - 145 mmol/L   Potassium 3.3 (L) 3.5 - 5.1 mmol/L   Chloride 100 98 - 111 mmol/L   CO2 22 22 - 32 mmol/L   Glucose, Bld 102 (H) 70 - 99 mg/dL    Comment: Glucose reference range applies only to samples taken after fasting for at least 8 hours.   BUN 6 6 - 20 mg/dL   Creatinine, Ser 0.71 0.61 - 1.24 mg/dL   Calcium 8.6 (L) 8.9 - 10.3 mg/dL   Total Protein 8.3 (H) 6.5 - 8.1 g/dL    Albumin 4.7 3.5 - 5.0 g/dL   AST 62 (H) 15 - 41 U/L   ALT 54 (H) 0 - 44  U/L   Alkaline Phosphatase 82 38 - 126 U/L   Total Bilirubin 0.9 0.3 - 1.2 mg/dL   GFR calc non Af Amer >60 >60 mL/min   GFR calc Af Amer >60 >60 mL/min   Anion gap 13 5 - 15    Comment: Performed at [facility], [address], [place], [place] [address]  Ethanol     Status: Abnormal   Collection Time: 10/06/19  2:28 AM  Result Value Ref Range   Alcohol, Ethyl (B) 356 (HH) <10 mg/dL    Comment: CRITICAL RESULT CALLED TO, READ BACK BY AND VERIFIED WITH: WALKER,T @ [id number] ON 10/06/19 BY JUW Performed at [facility], [address], [place], [place] [address]     Medications:  No current facility-administered medications for this encounter.   No current outpatient medications on file.    Musculoskeletal:   Psychiatric Specialty Exam: Physical Exam  Review of Systems  Blood pressure 129/84, pulse 73, temperature 98.1 F (36.7 C), temperature source Oral, resp. rate 14, height 6' (1.829 m), weight 77.1 kg, SpO2 98 %.Body mass index is 23.06 kg/m.  General Appearance: Casual  Eye Contact:  Fair  Speech:  Clear and Coherent  Volume:  Normal  Mood:  Anxious and Depressed  Affect:  Depressed and Flat  Thought Process:  Coherent  Orientation:  Full (Time, Place, and Person)  Thought Content:  Logical  Suicidal Thoughts:  No  Homicidal Thoughts:  No  Memory:  Immediate;   Fair Recent;   Fair  Judgement:  Fair  Insight:  Fair  Psychomotor Activity:  Normal  Concentration:  Concentration: Fair  Recall:  [employer] of Knowledge:  Fair  Language:  Fair  Akathisia:  No  Handed:  Right  AIMS (if indicated):     Assets:  Communication Skills Desire for Improvement Resilience Social Support  ADL's:  Intact  Cognition:  WNL  Sleep:        Treatment Plan Summary: Daily contact with patient to assess and evaluate symptoms and progress in treatment and Medication management  Disposition: Recommend  psychiatric Inpatient admission when medically cleared. - TTS seeking placement   This service was provided via telemedicine using a 2-way, interactive audio and video technology.  Names of all persons participating in this telemedicine service and their role in this encounter. Name: Lavone Lauture  Role: patient   Name: T.Jamae Tison Role: NP           Derrill [place], NP 10/06/2019 12:32 PM

## 2019-10-06 NOTE — Progress Notes (Signed)
Pt accepted to [facility]  Dr. Elaina Hoops is the accepting provider.   Call report to [phone] Nira Conn, RN @ [facility] ED notified.   Pt is  Voluntary Pt may be transported by [facility] Pt scheduled to arrive to the [place] unit after 4pm on Monday 10/07/19.    Darletta Moll MSW, [place] Worker Disposition  [facility] Ph: [phone] Fax: [phone]  10/06/2019 1:08 PM

## 2019-10-06 NOTE — ED Notes (Signed)
Pt calm and cooperative at this time. Pt denies any needs.

## 2019-10-06 NOTE — BH Assessment (Signed)
Tele Assessment Note   Patient Name: Terry Huang MRN: [id number] Referring Physician: Dr. Joseph Berkshire Location of Patient: [facility] Location of Provider: [place] is an 29 y.o. male presenting voluntarily with self-harming behaviors. Patient denied SI, HI and psychosis. Patient reported being on the phone with his girlfriend, stating "I told her I was doing dumb shit and she called the police". Patient admitted to drinking and cutting his wrist. Patient reported the police came to his house and brought him in to the hospital. Patient denied suicide attempt, when asked why patient stated, "its just the mental state I was in, I do damage but not to the point where I want things to end". Patient reported this cutting incident was an isolated event. Patient reported stressors/triggers include, "life, bills, just life". Patient reported grief/loss over fathers death 4 years ago and that he does not talk about it and has not had outpatient grief loss counseling. During assessment patient minimized incident.   Patient reported not receiving no current outpatient mental health services. Patient denied prior inpatient mental health treatment, and suicidal attempts. Patient reported he is not on any psych medications.   Patient reported living alone. Patient reported main support system is his brother and his male friend. 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 Patient reported money is a stressor. Patient was calm and pleasant during assessment.   BAL 356.   Diagnosis: Alcohol Use, recurrent  Past Medical History:  Past Medical History:  Diagnosis Date  . ADD (attention deficit disorder)     Past Surgical History:  Procedure Laterality Date  . HERNIA REPAIR      Family History: No family history on file.  Social History:  reports that he has quit smoking. He has never used smokeless tobacco. He reports current alcohol use. He  reports that he does not use drugs.  Additional Social History:  Alcohol / Drug Use Pain Medications: see MAR Prescriptions: see MAR Over the Counter: see MAR  CIWA:   COWS:    Allergies: No Known Allergies  Home Medications: (Not in a hospital admission)   OB/GYN Status:  No LMP for male patient.  General Assessment Data Location of Assessment: [facility] TTS Assessment: In system Is this a Tele or Face-to-Face Assessment?: Tele Assessment Is this an Initial Assessment or a Re-assessment for this encounter?: Initial Assessment Patient Accompanied by:: N/A Language Other than English: No Living Arrangements: Other (Comment)(home alone) What gender do you identify as?: Male Marital status: Single Living Arrangements: Alone Can pt return to current living arrangement?: Yes Admission Status: Voluntary Is patient capable of signing voluntary admission?: Yes Referral Source: Self/Family/Friend  Crisis Care Plan Living Arrangements: Alone Legal Guardian: Other:(self) Name of Psychiatrist: (none) Name of Therapist: (none)  Education Status Is patient currently in school?: No Is the patient employed, unemployed or receiving disability?: Employed  Risk to self with the past 6 months Suicidal Ideation: No Has patient been a risk to self within the past 6 months prior to admission? : No Suicidal Intent: (unable to assess) Has patient had any suicidal intent within the past 6 months prior to admission? : No Is patient at risk for suicide?: No Suicidal Plan?: No Has patient had any suicidal plan within the past 6 months prior to admission? : No Access to Means: No What has been your use of drugs/alcohol within the last 12 months?: (alcohol) Previous Attempts/Gestures: No How many times?: (0) Other Self  Harm Risks: (cutting self) Triggers for Past Attempts: (denied) Intentional Self Injurious Behavior: Cutting Comment - Self Injurious Behavior: (cut arm) Family Suicide  History: No Recent stressful life event(s): Conflict (Comment), Other (Comment)(relationship conflict with ex-girlfriend) Persecutory voices/beliefs?: No Depression: No Depression Symptoms: (denied) Substance abuse history and/or treatment for substance abuse?: No Suicide prevention information given to non-admitted patients: Not applicable  Risk to Others within the past 6 months Homicidal Ideation: No Does patient have any lifetime risk of violence toward others beyond the six months prior to admission? : No Thoughts of Harm to Others: No Current Homicidal Intent: No Current Homicidal Plan: No Access to Homicidal Means: No Identified Victim: (n/a) History of harm to others?: No Assessment of Violence: None Noted Violent Behavior Description: (none reported) Does patient have access to weapons?: Yes (Comment)(knives) Criminal Charges Pending?: No Does patient have a court date: No Is patient on probation?: No  Psychosis Hallucinations: None noted Delusions: None noted  Mental Status Report Appearance/Hygiene: Unremarkable Eye Contact: Good Motor Activity: Freedom of movement Speech: Logical/coherent Level of Consciousness: Alert Mood: Pleasant Affect: Appropriate to circumstance Anxiety Level: Minimal Thought Processes: Coherent, Relevant Judgement: Impaired Orientation: Person, Place, Time, Situation Obsessive Compulsive Thoughts/Behaviors: None  Cognitive Functioning Concentration: Fair Memory: Recent Intact Is patient IDD: No Insight: Poor Impulse Control: Poor Appetite: Fair Have you had any weight changes? : No Change Sleep: Decreased Total Hours of Sleep: (4-5) Vegetative Symptoms: None  ADLScreening [facility] Assessment Services) Patient's cognitive ability adequate to safely complete daily activities?: Yes Patient able to express need for assistance with ADLs?: Yes Independently performs ADLs?: Yes (appropriate for developmental age)  Prior Inpatient  Therapy Prior Inpatient Therapy: No  Prior Outpatient Therapy Prior Outpatient Therapy: No Does patient have an ACCT team?: No Does patient have Intensive In-House Services?  : No Does patient have [facility] services? : No Does patient have P4CC services?: No  ADL Screening (condition at time of admission) Patient's cognitive ability adequate to safely complete daily activities?: Yes Patient able to express need for assistance with ADLs?: Yes Independently performs ADLs?: Yes (appropriate for developmental age)  Regulatory affairs officer (For Healthcare) Does Patient Have a Medical Advance Directive?: No Would patient like information on creating a medical advance directive?: No - Patient declined  Disposition:  Disposition Initial Assessment Completed for this Encounter: Yes  Lindon Romp, NP, recommends overnight observation for safety and stabilization with psych reassessment in the AM. Patient BAL 356 at 0307 10/06/19.  This service was provided via telemedicine using a 2-way, interactive audio and video technology.  Names of all persons participating in this telemedicine service and their role in this encounter. Name: Luetta Nutting Role: Patient  Name: Kirtland Bouchard Role: TTS Clinician  Name:  Role:   Name:  Role:     Venora Maples 10/06/2019 4:40 AM

## 2019-10-06 NOTE — ED Notes (Signed)
Date and time results received: 10/06/19 0307  Test: ETOH Critical Value: 356  Name of Provider Notified: Betsey Holiday, MD  Orders Received? Or Actions Taken?: acknowledged

## 2019-10-06 NOTE — ED Triage Notes (Signed)
Pt here tonight after cutting his left arm with a knife. Pt admits to hx of same. Pt denies SI/HI.

## 2019-10-07 NOTE — ED Provider Notes (Signed)
Clinical Course as of Oct 07 1307  Sun Oct 06, 2019  0712 Pt signed out to me by Dr Betsey Holiday.  Briefly 29 yo male who is here voluntarily after self-induced superficial lacerations to forearms in the setting of significant etoh consumption.  He's intoxicated on exam, sleeping, otherwise hemodynamically stable.  Wounds are largely superficial on forearms, overnight EDP did not feel these needed sutures.  Plan to monitor for improving sobriety.  Patient is here voluntarily and denied SI/HI, reports he has done this kind of behavior in the past   [MT]  1145 Awake, arousable, awaiting [facility] reassessment   [MT]    Clinical Course User Index [MT] Anorah Trias, Carola Rhine, MD      Wyvonnia Dusky, MD 10/07/19 [phone]

## 2019-10-07 NOTE — ED Notes (Signed)
Able to Engineer, site for transport to [facility].

## 2019-10-07 NOTE — ED Notes (Signed)
Received report from previous RN, pt lying supine in bed, alert, able to answer questions, sitter remains at bedside, update given

## 2019-10-07 NOTE — ED Notes (Signed)
Report given to Kennyth Lose, Therapist, sports at [facility].

## 2019-10-07 NOTE — ED Notes (Signed)
Staff at [facility] not available to take report at this time. Nursing staff to call us back for report. I verified pt's room assignment for transport service when they arrive here.

## 2020-03-05 ENCOUNTER — Ambulatory Visit: Payer: Self-pay | Admitting: Family Medicine

## 2020-03-11 ENCOUNTER — Telehealth: Payer: Self-pay | Admitting: Family Medicine

## 2020-03-11 NOTE — Telephone Encounter (Signed)
Pt has not been seen in this office since 2019 and Pt has appt with Dr Lovena Le 08/19 NO show  This Pt does not have a provider since he has been so long being seen. Pt needs appt do I make him one or tell him to find a new provider?   Pt call back [phone]

## 2020-03-11 NOTE — Telephone Encounter (Signed)
Yes, pls find out what he's being seen for?  Acute issue or just a physical/well visit.   If acute issue, then schedule sooner with karen. If can wait then give next physical appt/est care appt.   Thx.   Dr. Lovena Le

## 2020-03-11 NOTE — Telephone Encounter (Signed)
Pt scheduled 8/26 with Santiago Glad

## 2020-03-12 ENCOUNTER — Encounter: Payer: Self-pay | Admitting: Family Medicine

## 2020-03-12 ENCOUNTER — Other Ambulatory Visit: Payer: Self-pay

## 2020-03-12 ENCOUNTER — Ambulatory Visit (INDEPENDENT_AMBULATORY_CARE_PROVIDER_SITE_OTHER): Payer: Self-pay | Admitting: Family Medicine

## 2020-03-12 VITALS — BP 130/82 | HR 73 | Temp 97.0°F | Wt 160.2 lb

## 2020-03-12 DIAGNOSIS — R42 Dizziness and giddiness: Secondary | ICD-10-CM

## 2020-03-12 NOTE — Progress Notes (Signed)
Patient ID: Terry Huang, male    DOB: 1990-10-12, 29 y.o.   MRN: [id number]   Chief Complaint  Patient presents with  . nausea and dizziness    1 1/2 to 2 weeks- comes and goes- also will feel hot- started with a fever 1 1/2 weeks ago of 101- but no fevers now   Subjective:    HPI 2 weeks ago started feeling nausea and dizziness. Was sent home from work once for fever of 101.0.. No fever since. Nausea has resolved.  Medical History Terry Huang has a past medical history of ADD (attention deficit disorder).   Outpatient Encounter Medications as of 03/12/2020  Medication Sig  . omeprazole (PRILOSEC) 20 MG capsule Take 20 mg by mouth daily as needed. (Patient not taking: Reported on 03/12/2020)   No facility-administered encounter medications on file as of 03/12/2020.     Review of Systems  Constitutional: Positive for fever.       Once.  HENT: Negative.   Eyes: Negative.   Respiratory: Negative.   Cardiovascular: Negative.   Gastrointestinal: Negative.   Skin: Negative.   Neurological: Positive for dizziness and light-headedness.       Occasionally with position changes.  Hematological: Negative.      Vitals BP 130/82   Pulse 73   Temp (!) 97 F (36.1 C) (Oral)   Wt 160 lb 3.2 oz (72.7 kg)   SpO2 97%   BMI 21.73 kg/m   Objective:   Physical Exam Vitals and nursing note reviewed.  Constitutional:      Appearance: Normal appearance.  HENT:     Right Ear: Tympanic membrane normal.     Left Ear: Tympanic membrane normal.  Cardiovascular:     Rate and Rhythm: Normal rate and regular rhythm.     Pulses: Normal pulses.     Heart sounds: Normal heart sounds.  Pulmonary:     Effort: Pulmonary effort is normal.     Breath sounds: Normal breath sounds.  Skin:    General: Skin is warm and dry.  Neurological:     Mental Status: He is alert and oriented to person, place, and time.  Psychiatric:        Mood and Affect: Mood normal.        Behavior: Behavior normal.       Assessment and Plan   1. Dizziness - Basic metabolic panel   Terry Huang presents today after a 2 week period where he was feeling "dizzy" and nauseated. He works in a [employer] and he doesn't have A/C at home. He reports drinking 10+ bottles of water every day.  Terry Huang needs a return to work note for Monday. With all the water he drinks, I want to make sure he isn't having low sodium levels.  He feels like he has mostly returned to normal but occassionally feels "light-heated" with position changes. He will get a BMP checked today when he leaves and he will add electrolyte packets to a couple of bottles of water per day.   Orthostatic vitals in the office-- negative for postural hypotension: lying: 130/90-79; sitting: 142/92- 80; standing 138/86-85.   Agrees with plan of care discussed today. Understands warning signs to seek further care: increased nausea and dizziness. Any other symptoms that make you worried that something is wrong. Understands to follow-up if symptoms do not improve or if anything changes.  Will notify you of BMP results once they are available.   Chalmers Guest, NP  03/12/2020  

## 2020-03-12 NOTE — Progress Notes (Signed)
   Patient ID: Terry Huang, male    DOB: 1991/03/08, 29 y.o.   MRN: [id number]   Chief Complaint  Patient presents with  . nausea and dizziness    1 1/2 to 2 weeks- comes and goes- also will feel hot- started with a fever 1 1/2 weeks ago of 101- but no fevers now   Subjective:    HPI   Medical History Terry Huang has a past medical history of ADD (attention deficit disorder).   Outpatient Encounter Medications as of 03/12/2020  Medication Sig  . omeprazole (PRILOSEC) 20 MG capsule Take 20 mg by mouth daily as needed. (Patient not taking: Reported on 03/12/2020)   No facility-administered encounter medications on file as of 03/12/2020.     Review of Systems   Vitals BP 130/82   Pulse 73   Temp (!) 97 F (36.1 C) (Oral)   Wt 160 lb 3.2 oz (72.7 kg)   SpO2 97%   BMI 21.73 kg/m   Objective:   Physical Exam   Assessment and Plan   There are no diagnoses linked to this encounter.

## 2020-03-12 NOTE — Patient Instructions (Signed)
Preventing Heat Exhaustion, Adult Heat exhaustion happens when your body gets too hot (overheated) from hot weather or from exercise. Untreated heat exhaustion could lead to heat stroke. Heat stroke can be deadly. How can heat exhaustion affect me? Early warning signs of heat exhaustion are:  Weakness.  Fatigue.  Stomach cramps.  Arm pain.  Leg cramps. Later symptoms of heat exhaustion include:  Heavy sweating.  Clammy skin.  Rapid, weak pulse.  Nausea or vomiting.  Dizziness.  Headache.  Fainting. If you have signs and symptoms of heat exhaustion, move to a cool place, loosen your clothing, and drink water or a sports drink. Then, put cool, wet compresses on your body or get into a cool bath or shower. What can increase my risk? People who work or exercise outside in hot weather have the highest risk of heat exhaustion. You may also be at higher risk if you:  Are over age 60. Older adults have a greater risk for heat exhaustion than younger adults.  Are overweight.  Have high blood pressure.  Have heart disease. What actions can I take to prevent heat exhaustion?      Avoid being outside on very hot days. Check your local news for extreme heat alerts or warnings.  In extreme heat, stay in an air-conditioned environment until the temperature cools off.  Check with your health care provider before starting any new exercise or activity. Ask about any health conditions or medicines that might increase your risk for heat exhaustion.  Wear lightweight, light-colored, and loose-fitting clothing in warm weather.  Do outdoor activities when it is cooler. This may be in the morning, late afternoon, or evening. Take breaks in the shade.  Do not work or exercise in the heat when you feel unwell or have been sick.  Start any new work or exercise activity gradually.  Protect yourself from the sun by wearing a broad-brimmed hat and using at least SPF 15 broad-spectrum  sunscreen.  Drink enough water or sports drink to keep your urine pale yellow. When it is hot, drink every 15 to 20 minutes, even if you are not thirsty.  Do not go out in the heat after a heavy meal.  Do not drink alcohol or caffeinated drinks when it is very hot outside.  If you have friends or family members who are older adults: ? Do not leave an older adult alone in a hot car. ? Make sure older adults have access to air-conditioning on very hot days. Remind them to drink enough fluids. Check on them at least twice a day if you can. Where to find more information  Centers for Disease Control and Prevention (CDC): PurpleGadgets.be  American Academy of Family Physicians (AAFP): https://familydoctor.org/condition/heat-exhaustion-heatstroke  Optometrist (AAOS): https://orthoinfo.aaos.org/en/diseases--conditions/heat-injury-and-heat-exhaustion Contact a health care provider if:  You faint.  You feel weak or dizzy.  You have any signs or symptoms of heat exhaustion that last more than one hour. Get help right away if you have signs of heat stroke:  Body temperature of 103F (39.4C) or higher.  Hot, dry, red skin.  Fast, thumping pulse.  Confusion.  Loss of consciousness. Summary  Heat exhaustion happens when your body gets overheated and cannot cool down.  Avoid being outside on very hot days. Check your local news for extreme heat alerts or warnings. When you are out in the heat, take steps to protect yourself from the sun and stay hydrated.  If you have signs or symptoms of heat  exhaustion, get out of the heat, drink fluids, take steps to cool down, and contact a health care provider.  Get help right away if you have signs or symptoms of heat stroke. This information is not intended to replace advice given to you by your health care provider. Make sure you discuss any questions you have with your health  care provider. Document Revised: 03/29/2019 Document Reviewed: 10/11/2017 Elsevier Patient Education  [employer].

## 2020-07-01 ENCOUNTER — Encounter: Payer: Self-pay | Admitting: Emergency Medicine

## 2020-07-01 ENCOUNTER — Ambulatory Visit
Admission: EM | Admit: 2020-07-01 | Discharge: 2020-07-01 | Disposition: A | Discharge status: Home / Self Care | Payer: Self-pay | Attending: Physician Assistant | Admitting: Physician Assistant

## 2020-07-01 ENCOUNTER — Other Ambulatory Visit: Payer: Self-pay

## 2020-07-01 ENCOUNTER — Ambulatory Visit: Payer: Self-pay | Admitting: Family Medicine

## 2020-07-01 DIAGNOSIS — Z1152 Encounter for screening for COVID-19: Secondary | ICD-10-CM

## 2020-07-01 DIAGNOSIS — J069 Acute upper respiratory infection, unspecified: Secondary | ICD-10-CM

## 2020-07-01 NOTE — ED Triage Notes (Signed)
Pt had sore throat that started on Friday. That has resolved but now pt is having headache that started today, mild nausea and feels hot.

## 2020-07-01 NOTE — Discharge Instructions (Signed)
Your covid and influenza test are pending

## 2020-07-03 LAB — COVID-19, FLU A+B NAA
Influenza A, NAA: NOT DETECTED
Influenza B, NAA: NOT DETECTED
SARS-CoV-2, NAA: NOT DETECTED

## 2020-07-05 NOTE — ED Provider Notes (Signed)
[facility]    CSN: [id number] Arrival date & time: 07/01/20  1533      History   Chief Complaint No chief complaint on file.   HPI Terry Huang is a 29 y.o. male.   The history is provided by the patient. No language interpreter was used.  Cough Cough characteristics:  Non-productive Sputum characteristics:  Nondescript Severity:  Mild Onset quality:  Gradual Timing:  Constant Progression:  Worsening Chronicity:  New Smoker: no   Context: upper respiratory infection   Relieved by:  Nothing Worsened by:  Nothing Ineffective treatments:  None tried Associated symptoms: no shortness of breath     Past Medical History:  Diagnosis Date   ADD (attention deficit disorder)     There are no problems to display for this patient.   Past Surgical History:  Procedure Laterality Date   HERNIA REPAIR         Home Medications    Prior to Admission medications   Medication Sig Start Date End Date Taking? Authorizing Provider  omeprazole (PRILOSEC) 20 MG capsule Take 20 mg by mouth daily as needed. Patient not taking: Reported on 03/12/2020    [provider]    Family History No family history on file.  Social History Social History   Tobacco Use   Smoking status: Former Smoker   Smokeless tobacco: Never Used   Tobacco comment: pt smokes Surveyor, quantity Use: Every day  Substance Use Topics   Alcohol use: Yes    Comment: socially    Drug use: No     Allergies   Patient has no known allergies.   Review of Systems Review of Systems  Respiratory: Positive for cough. Negative for shortness of breath.   All other systems reviewed and are negative.    Physical Exam Triage Vital Signs ED Triage Vitals  Enc Vitals Group     BP 07/01/20 1619 (!) 157/97     Pulse Rate 07/01/20 1619 84     Resp 07/01/20 1619 18     Temp 07/01/20 1619 99.3 F (37.4 C)     Temp Source 07/01/20 1619 Oral     SpO2 07/01/20  1619 97 %     Weight --      Height 07/01/20 1621 6' (1.829 m)     Head Circumference --      Peak Flow --      Pain Score 07/01/20 1621 0     Pain Loc --      Pain Edu? --      Excl. in [place]? --    No data found.  Updated Vital Signs BP (!) 157/97 (BP Location: Right Arm)    Pulse 84    Temp 99.3 F (37.4 C) (Oral)    Resp 18    Ht 6' (1.829 m)    SpO2 97%    BMI 21.73 kg/m   Visual Acuity Right Eye Distance:   Left Eye Distance:   Bilateral Distance:    Right Eye Near:   Left Eye Near:    Bilateral Near:     Physical Exam Vitals and nursing note reviewed.  Constitutional:      Appearance: He is well-developed and well-nourished.  HENT:     Head: Normocephalic and atraumatic.  Eyes:     Conjunctiva/sclera: Conjunctivae normal.  Cardiovascular:     Rate and Rhythm: Normal rate and regular rhythm.     Heart sounds: No murmur  heard.   Pulmonary:     Effort: Pulmonary effort is normal. No respiratory distress.     Breath sounds: Normal breath sounds.  Abdominal:     Palpations: Abdomen is soft.  Musculoskeletal:        General: No edema.     Cervical back: Neck supple.  Skin:    General: Skin is warm and dry.  Neurological:     General: No focal deficit present.     Mental Status: He is alert.  Psychiatric:        Mood and Affect: Mood and affect and mood normal.      UC Treatments / Results  Labs (all labs ordered are listed, but only abnormal results are displayed) Labs Reviewed  COVID-19, FLU A+B NAA   Narrative:    Test(s) 140142-Influenza A, NAA; 140143-Influenza B, NAA was developed and its performance characteristics determined by [facility]. It has not been cleared or approved by the Food and Drug Administration. Performed at:  [address] [place], [place]  [id number] Lab Director: Rush Farmer MD, Phone:  [phone]    EKG   Radiology No results found.  Procedures Procedures (including critical care  time)  Medications Ordered in UC Medications - No data to display  Initial Impression / Assessment and Plan / UC Course  I have reviewed the triage vital signs and the nursing notes.  Pertinent labs & imaging results that were available during my care of the patient were reviewed by me and considered in my medical decision making (see chart for details).     Mdm: Covid and influenza pending.  Final Clinical Impressions(s) / UC Diagnoses   Final diagnoses:  Encounter for screening for COVID-19  Acute upper respiratory infection     Discharge Instructions     Your covid and influenza test are pending   ED Prescriptions    None     PDMP not reviewed this encounter.  An After Visit Summary was printed and given to the patient.    Fransico Meadow, [place] 07/05/20 1644

## 2021-06-08 ENCOUNTER — Other Ambulatory Visit: Payer: Self-pay

## 2021-06-08 ENCOUNTER — Encounter: Payer: Self-pay | Admitting: Nurse Practitioner

## 2021-06-08 ENCOUNTER — Ambulatory Visit (INDEPENDENT_AMBULATORY_CARE_PROVIDER_SITE_OTHER): Payer: Self-pay | Admitting: Nurse Practitioner

## 2021-06-08 ENCOUNTER — Ambulatory Visit (HOSPITAL_COMMUNITY)
Admission: RE | Admit: 2021-06-08 | Discharge: 2021-06-08 | Disposition: A | Discharge status: Home / Self Care | Payer: BC Managed Care – PPO | Source: Ambulatory Visit | Attending: Nurse Practitioner | Admitting: Nurse Practitioner

## 2021-06-08 VITALS — BP 140/98 | HR 83 | Temp 98.6°F | Ht 72.0 in | Wt 161.0 lb

## 2021-06-08 DIAGNOSIS — N5089 Other specified disorders of the male genital organs: Secondary | ICD-10-CM

## 2021-06-08 DIAGNOSIS — R030 Elevated blood-pressure reading, without diagnosis of hypertension: Secondary | ICD-10-CM

## 2021-06-08 NOTE — Progress Notes (Signed)
   Subjective:    Patient ID: Terry Huang, male    DOB: 1990/09/11, 30 y.o.   MRN: [id number]  HPI  Patient accompanied by mother today. Patient states that he noticed a "hard place" on R testicle about 6 months. Patient states that area has gotten larger since then. Patient denies pain, fever, chills. Patient denies having unprotected sex or penile discharge. Patient would like an ultrasound.   Review of Systems  Genitourinary:  Positive for scrotal swelling. Negative for genital sores, penile discharge, penile pain, penile swelling and testicular pain.      Objective:   Physical Exam Exam conducted with a chaperone present.  Constitutional:      Appearance: Normal appearance.  Cardiovascular:     Rate and Rhythm: Normal rate and regular rhythm.  Pulmonary:     Effort: Pulmonary effort is normal.  Abdominal:     Hernia: There is no hernia in the left inguinal area or right inguinal area.  Genitourinary:    Penis: Circumcised. Swelling present. No erythema, tenderness or discharge.      Testes: Cremasteric reflex is present.        Right: Mass, swelling and testicular hydrocele present. Tenderness or varicocele not present. Right testis is descended. Cremasteric reflex is present.         Left: Mass, tenderness, swelling, testicular hydrocele or varicocele not present. Left testis is descended. Cremasteric reflex is present.      Epididymis:     Right: Normal.     Left: Normal.    Neurological:     Mental Status: He is alert.          Assessment & Plan:   1. Scrotal swelling - Likely Hydrocele - However, Scrotum U/S with doppler ordered to r/o testicular CA - Acute orchitis and epididymitis not likely at this time due to lack of pain and inflammation - US Scrotum with doppler - Offered referral to urology, however patient does not have insurance at this time and would like to defer specialist appointments until he has insurance in ~2 months.  - Follow up in 4 weeks  to assess scrotal swelling and/or any changes to symptoms.  - Review s/s of torsion or infection. Instructed patient to go to the ED if he experience pain to scrotum or fever or chills.    2. Elevated BP - BP 154/97 initially and 140/98 on recheck - Follow in 4 weeks for BP check - Monitor BP at home if possible

## 2021-06-09 ENCOUNTER — Encounter: Payer: Self-pay | Admitting: Urology

## 2021-06-09 ENCOUNTER — Ambulatory Visit (INDEPENDENT_AMBULATORY_CARE_PROVIDER_SITE_OTHER): Payer: BC Managed Care – PPO | Admitting: Urology

## 2021-06-09 VITALS — BP 170/97 | HR 77 | Temp 99.1°F

## 2021-06-09 DIAGNOSIS — N5089 Other specified disorders of the male genital organs: Secondary | ICD-10-CM | POA: Diagnosis not present

## 2021-06-09 LAB — URINALYSIS, ROUTINE W REFLEX MICROSCOPIC
Bilirubin, UA: NEGATIVE
Glucose, UA: NEGATIVE
Ketones, UA: NEGATIVE
Leukocytes,UA: NEGATIVE
Nitrite, UA: NEGATIVE
Protein,UA: NEGATIVE
RBC, UA: NEGATIVE
Specific Gravity, UA: 1.01 (ref 1.005–1.030)
Urobilinogen, Ur: 0.2 mg/dL (ref 0.2–1.0)
pH, UA: 6.5 (ref 5.0–7.5)

## 2021-06-09 NOTE — Progress Notes (Addendum)
06/09/2021 3:09 PM   Terry Huang 1991-04-16 [id number]  Referring provider: Coral Spikes, DO [place],  [place] [address]  Right testis mass   HPI: Terry Huang is a 30yo here for evaluation of a right testis mass. He noted increased size of his right testis starting 6 months ago. It has been growing in size and now he has discomfort with walking. No night sweats, no unexplained weight loss, no difficulty breathing. He underwent scrotal US yesterday which showed a large right testis mass concerning for malignancy.    PMH: Past Medical History:  Diagnosis Date   ADD (attention deficit disorder)     Surgical History: Past Surgical History:  Procedure Laterality Date   HERNIA REPAIR      Home Medications:  Allergies as of 06/09/2021   No Known Allergies      Medication List        Accurate as of June 09, 2021  3:09 PM. If you have any questions, ask your nurse or doctor.          citalopram 20 MG tablet Commonly known as: CELEXA Take 20 mg by mouth daily.   omeprazole 20 MG capsule Commonly known as: PRILOSEC Take 20 mg by mouth daily as needed.        Allergies: No Known Allergies  Family History: History reviewed. No pertinent family history.  Social History:  reports that he has quit smoking. He has never used smokeless tobacco. He reports current alcohol use. He reports that he does not use drugs.  ROS: All other review of systems were reviewed and are negative except what is noted above in HPI  Physical Exam: BP (!) 170/97   Pulse 77   Temp 99.1 F (37.3 C)   Constitutional:  Alert and oriented, No acute distress. HEENT: [place] AT, moist mucus membranes.  Trachea midline, no masses. Cardiovascular: No clubbing, cyanosis, or edema. Respiratory: Normal respiratory effort, no increased work of breathing. GI: Abdomen is soft, nontender, nondistended, no abdominal masses GU: No CVA tenderness. Circumcised phallus. No  masses/lesions on penis, left testis, scrotum. 10cm right testis mass that is firm  Lymph: No cervical or inguinal lymphadenopathy. Skin: No rashes, bruises or suspicious lesions. Neurologic: Grossly intact, no focal deficits, moving all 4 extremities. Psychiatric: Normal mood and affect.  Laboratory Data: Lab Results  Component Value Date   WBC 7.3 10/06/2019   HGB 16.4 10/06/2019   HCT 46.2 10/06/2019   MCV 92.2 10/06/2019   PLT 383 10/06/2019    Lab Results  Component Value Date   CREATININE 0.71 10/06/2019    No results found for: PSA  No results found for: TESTOSTERONE  No results found for: HGBA1C  Urinalysis No results found for: COLORURINE, APPEARANCEUR, LABSPEC, PHURINE, GLUCOSEU, HGBUR, BILIRUBINUR, KETONESUR, PROTEINUR, UROBILINOGEN, NITRITE, LEUKOCYTESUR  No results found for: LABMICR, [place], RBCUA, LABEPIT, MUCUS, BACTERIA  Pertinent Imaging: Scrotal US yesterday: Images reviewed and discussed with the patient Results for orders placed in visit on 01/02/01  DG Abd 1 View  Narrative FINDINGS HISTORY: ABDOMINAL PAIN WITH OCCASIONAL NAUSEA. ABDOMEN 1 VIEW: NORMAL BOWEL GAS PATTERN WITH SMALL AMOUNT OF STOOL THROUGHOUT THE COLON.  NO DILATED BOWEL LOOPS OR SUPINE FREE AIR.  NO SUSPICIOUS CALCIFICATIONS. IMPRESSION NO ACUTE ABNORMALITIES WITHIN THE ABDOMEN.  No results found for this or any previous visit.  No results found for this or any previous visit.  No results found for this or any previous visit.  No  results found for this or any previous visit.  No results found for this or any previous visit.  No results found for this or any previous visit.  No results found for this or any previous visit.   Assessment & Plan:    1. Testis mass -We discussed the natural history of testis masses and the concern for malignancy. We discussed the treatment options including right radical orchiectomy and after discussing the options the patient elects to  proceed with surgery. Risks/benefits/alternatives discussed - Beta HCG, Quant (tumor marker) - Lactate Dehydrogenase - AFP tumor marker   No follow-ups on file.  Nicolette Bang, MD  [facility] [place]

## 2021-06-09 NOTE — H&P (View-Only) (Signed)
06/09/2021 3:09 PM   Barry Dienes [birth year] [id number]  Referring provider: Coral Spikes, DO [place],  [place] [address]  Right testis mass   HPI: Mr Coffin is a 30yo here for evaluation of a right testis mass. He noted increased size of his right testis starting 6 months ago. It has been growing in size and now he has discomfort with walking. No night sweats, no unexplained weight loss, no difficulty breathing. He underwent scrotal US yesterday which showed a large right testis mass concerning for malignancy.    PMH: Past Medical History:  Diagnosis Date   ADD (attention deficit disorder)     Surgical History: Past Surgical History:  Procedure Laterality Date   HERNIA REPAIR      Home Medications:  Allergies as of 06/09/2021   No Known Allergies      Medication List        Accurate as of June 09, 2021  3:09 PM. If you have any questions, ask your nurse or doctor.          citalopram 20 MG tablet Commonly known as: CELEXA Take 20 mg by mouth daily.   omeprazole 20 MG capsule Commonly known as: PRILOSEC Take 20 mg by mouth daily as needed.        Allergies: No Known Allergies  Family History: History reviewed. No pertinent family history.  Social History:  reports that he has quit smoking. He has never used smokeless tobacco. He reports current alcohol use. He reports that he does not use drugs.  ROS: All other review of systems were reviewed and are negative except what is noted above in HPI  Physical Exam: BP (!) 170/97   Pulse 77   Temp 99.1 F (37.3 C)   Constitutional:  Alert and oriented, No acute distress. HEENT: [place] AT, moist mucus membranes.  Trachea midline, no masses. Cardiovascular: No clubbing, cyanosis, or edema. Respiratory: Normal respiratory effort, no increased work of breathing. GI: Abdomen is soft, nontender, nondistended, no abdominal masses GU: No CVA tenderness. Circumcised phallus. No  masses/lesions on penis, left testis, scrotum. 10cm right testis mass that is firm  Lymph: No cervical or inguinal lymphadenopathy. Skin: No rashes, bruises or suspicious lesions. Neurologic: Grossly intact, no focal deficits, moving all 4 extremities. Psychiatric: Normal mood and affect.  Laboratory Data: Lab Results  Component Value Date   WBC 7.3 10/06/2019   HGB 16.4 10/06/2019   HCT 46.2 10/06/2019   MCV 92.2 10/06/2019   PLT 383 10/06/2019    Lab Results  Component Value Date   CREATININE 0.71 10/06/2019    No results found for: PSA  No results found for: TESTOSTERONE  No results found for: HGBA1C  Urinalysis No results found for: COLORURINE, APPEARANCEUR, LABSPEC, PHURINE, GLUCOSEU, HGBUR, BILIRUBINUR, KETONESUR, PROTEINUR, UROBILINOGEN, NITRITE, LEUKOCYTESUR  No results found for: LABMICR, [place], RBCUA, LABEPIT, MUCUS, BACTERIA  Pertinent Imaging: Scrotal US yesterday: Images reviewed and discussed with the patient Results for orders placed in visit on 01/02/01  DG Abd 1 View  Narrative FINDINGS HISTORY: ABDOMINAL PAIN WITH OCCASIONAL NAUSEA. ABDOMEN 1 VIEW: NORMAL BOWEL GAS PATTERN WITH SMALL AMOUNT OF STOOL THROUGHOUT THE COLON.  NO DILATED BOWEL LOOPS OR SUPINE FREE AIR.  NO SUSPICIOUS CALCIFICATIONS. IMPRESSION NO ACUTE ABNORMALITIES WITHIN THE ABDOMEN.  No results found for this or any previous visit.  No results found for this or any previous visit.  No results found for this or any previous visit.  No  results found for this or any previous visit.  No results found for this or any previous visit.  No results found for this or any previous visit.  No results found for this or any previous visit.   Assessment & Plan:    1. Testis mass -We discussed the natural history of testis masses and the concern for malignancy. We discussed the treatment options including right radical orchiectomy and after discussing the options the patient elects to  proceed with surgery. Risks/benefits/alternatives discussed - Beta HCG, Quant (tumor marker) - Lactate Dehydrogenase - AFP tumor marker   No follow-ups on file.  Nicolette Bang, MD  [facility] [place]

## 2021-06-09 NOTE — Progress Notes (Signed)
Urological Symptom Review  Patient is experiencing the following symptoms: N/a   Review of Systems  Gastrointestinal (upper)  : Negative for upper GI symptoms  Gastrointestinal (lower) : Negative for lower GI symptoms  Constitutional : Negative for symptoms  Skin: Negative for skin symptoms  Eyes: Negative for eye symptoms  Ear/Nose/Throat : Negative for Ear/Nose/Throat symptoms  Hematologic/Lymphatic: Negative for Hematologic/Lymphatic symptoms  Cardiovascular : Negative for cardiovascular symptoms  Respiratory : Negative for respiratory symptoms  Endocrine: Negative for endocrine symptoms  Musculoskeletal: Negative for musculoskeletal symptoms  Neurological: Negative for neurological symptoms  Psychologic: Depression Anxiety

## 2021-06-09 NOTE — Patient Instructions (Signed)
Orchiectomy An orchiectomy is the removal of one or both testicles. It is most often done to treat cancer of the testicles. Less commonly, it may be done in men with prostate cancer, or to prevent cancer in men whose testicles did not develop normally. An orchiectomy may also be needed when an injury to a testicle cannot be repaired. The testicles can be replaced with artificial testicles (prostheses). Tell a health care provider about: Any allergies you have. All medicines you are taking, including vitamins, herbs, eye drops, creams, and over-the-counter medicines. Any problems you or family members have had with anesthetic medicines. Any bleeding disorders you have. Any surgeries you have had. Any medical conditions you have. What are the risks? Generally, this is a safe procedure. However, problems may occur, including: Infection. Bleeding inside the sac that holds the testicles (scrotum). This is called a scrotal hematoma. Pain. Damage to nearby organs or structures, such as the nerves. Discharge from the surgical site. Allergic reactions to medicines. Inability to produce sperm (infertility), if both testicles are removed. Hot flashes, if both testicles are removed. What happens before the procedure? When to stop eating and drinking  Follow instructions from your health care provider about what you may eat and drink before your procedure. These may include: 8 hours before your procedure Stop eating most foods. Do not eat meat, fried foods, or fatty foods. Eat only light foods, such as toast or crackers. All liquids are okay except energy drinks and alcohol. 6 hours before your procedure Stop eating. Drink only clear liquids, such as water, clear fruit juice, black coffee, plain tea, and sports drinks. Do not drink energy drinks or alcohol. 2 hours before the procedure Stop drinking all liquids. You may be allowed to take medicines with small sips of water. Medicines Ask your  health care provider about: Changing or stopping your regular medicines. This is especially important if you are taking diabetes medicines or blood thinners. Taking medicines such as aspirin and ibuprofen. These medicines can thin your blood. Do not take these medicines unless your health care provider tells you to take them. Taking over-the-counter medicines, vitamins, herbs, and supplements. General instructions You may need to collect your sperm in a sperm bank because this procedure can affect your ability to produce sperm (fertility). Ask your health care provider if this is necessary. Do not use any products that contain nicotine or tobacco for at least 4 weeks before the procedure. These products include cigarettes, chewing tobacco, and vaping devices, such as e-cigarettes. If you need help quitting, ask your health care provider. Plan to have someone take you home from the hospital or clinic. Surgery safety Ask your health care provider: How your surgery site will be marked. What steps will be taken to help prevent infection. These steps may include: Removing hair at the surgery site. Washing skin with a germ-killing soap. Taking antibiotic medicine. What happens during the procedure? An IV will be inserted into one of your veins. You will be given one or more of the following: A medicine to help you relax (sedative). A medicine to numb the area (local anesthetic). A medicine to make you fall asleep (general anesthetic). This procedure may involve removal of one or both testicles. The steps for the procedure will depend on the reason for the procedure. If your procedure is for treatment of testicular cancer: An incision will be made in the groin. The testicle and the spermatic cord will be removed through the groin incision. A prosthetic  filled with saline may be inserted to fill the space in the scrotum where the testicle was removed. If your procedure is for treatment of prostate  cancer: An incision will be made in the scrotum. The testicle will be removed through the incision in the scrotum. A prosthetic filled with saline may be inserted to fill the space in the scrotum where the testicle was removed. After the removal, the incision will be closed with stitches (sutures), skin glue, or adhesive strips. A sterile bandage (dressing) will be applied to the incision site. The procedure may vary among health care providers and hospitals. What happens after the procedure? Your blood pressure, heart rate, breathing rate, and blood oxygen level will be monitored until you leave the hospital or clinic. You will be allowed to go home once you are awake, stable, taking fluids well, and have no other problems. You may have scrotal support. If the scrotal support irritates your incision site, you may remove the support. You will have a sterile dressing. You will be instructed when to remove the dressing and when you can shower. If you were given a sedative during the procedure, it can affect you for several hours. Do not drive or operate machinery until your health care provider says that it is safe. Summary Orchiectomy is a surgical procedure to remove one or both testicles. It is most often done to treat cancer of the testicles. Tell your health care provider about any other medical conditions that you have and the medicines that you take to treat those conditions. Follow instructions from your health care provider about what to eat and drink before the procedure. One or both testicles will be removed. A prosthesis filled with saline may be inserted to fill the space in the scrotum where the testicle was removed. You will be monitored closely after the procedure. Plan to have someone take you home from the hospital or clinic. This information is not intended to replace advice given to you by your health care provider. Make sure you discuss any questions you have with your health  care provider. Document Revised: 01/01/2021 Document Reviewed: 01/01/2021 Elsevier Patient Education  [place].

## 2021-06-10 LAB — BETA HCG QUANT (REF LAB): hCG Quant: 8 m[IU]/mL — ABNORMAL HIGH (ref 0–3)

## 2021-06-10 LAB — LACTATE DEHYDROGENASE: LDH: 392 IU/L — ABNORMAL HIGH (ref 121–224)

## 2021-06-10 LAB — AFP TUMOR MARKER: AFP, Serum, Tumor Marker: 4.1 ng/mL (ref 0.0–5.7)

## 2021-06-12 ENCOUNTER — Emergency Department (HOSPITAL_COMMUNITY)
Admission: EM | Admit: 2021-06-12 | Discharge: 2021-06-12 | Discharge status: Against Medical Advice | Payer: BC Managed Care – PPO | Attending: Emergency Medicine | Admitting: Emergency Medicine

## 2021-06-12 ENCOUNTER — Other Ambulatory Visit: Payer: Self-pay

## 2021-06-12 ENCOUNTER — Other Ambulatory Visit: Payer: Self-pay | Admitting: Urology

## 2021-06-12 ENCOUNTER — Encounter (HOSPITAL_COMMUNITY): Payer: Self-pay | Admitting: Emergency Medicine

## 2021-06-12 DIAGNOSIS — Z5321 Procedure and treatment not carried out due to patient leaving prior to being seen by health care provider: Secondary | ICD-10-CM | POA: Insufficient documentation

## 2021-06-12 DIAGNOSIS — C6290 Malignant neoplasm of unspecified testis, unspecified whether descended or undescended: Secondary | ICD-10-CM | POA: Insufficient documentation

## 2021-06-12 DIAGNOSIS — N50811 Right testicular pain: Secondary | ICD-10-CM | POA: Diagnosis present

## 2021-06-12 HISTORY — DX: Malignant (primary) neoplasm, unspecified: C80.1

## 2021-06-12 MED ORDER — HYDROCODONE-ACETAMINOPHEN 5-325 MG PO TABS: 1.0000 | ORAL_TABLET | ORAL | 0 refills | Status: DC | PRN

## 2021-06-12 NOTE — ED Notes (Signed)
Pt and family to nurses station stating that he is no longer is pain and is going to leave. Informed he can always come back.

## 2021-06-12 NOTE — ED Triage Notes (Signed)
Pt with recent dx of testicular cancer. Here for c/o pain in R testicle. Pt scheduled for sx on Thursday.

## 2021-06-16 ENCOUNTER — Encounter (HOSPITAL_COMMUNITY)
Admission: RE | Admit: 2021-06-16 | Discharge: 2021-06-16 | Disposition: A | Discharge status: Home / Self Care | Payer: Self-pay | Source: Ambulatory Visit | Attending: Urology | Admitting: Urology

## 2021-06-16 ENCOUNTER — Encounter (HOSPITAL_COMMUNITY): Payer: Self-pay

## 2021-06-16 ENCOUNTER — Other Ambulatory Visit: Payer: Self-pay

## 2021-06-16 DIAGNOSIS — Z01818 Encounter for other preprocedural examination: Secondary | ICD-10-CM

## 2021-06-17 ENCOUNTER — Encounter (HOSPITAL_COMMUNITY): Payer: Self-pay | Admitting: Urology

## 2021-06-17 ENCOUNTER — Ambulatory Visit (HOSPITAL_COMMUNITY)
Admission: RE | Admit: 2021-06-17 | Discharge: 2021-06-17 | Disposition: A | Discharge status: Home / Self Care | Payer: BC Managed Care – PPO | Source: Ambulatory Visit | Attending: Urology | Admitting: Urology

## 2021-06-17 ENCOUNTER — Ambulatory Visit (HOSPITAL_COMMUNITY): Payer: BC Managed Care – PPO | Admitting: Anesthesiology

## 2021-06-17 ENCOUNTER — Encounter (HOSPITAL_COMMUNITY)
Admission: RE | Disposition: A | Discharge status: Home / Self Care | Payer: Self-pay | Source: Ambulatory Visit | Attending: Urology

## 2021-06-17 DIAGNOSIS — D0769 Carcinoma in situ of other male genital organs: Secondary | ICD-10-CM | POA: Diagnosis not present

## 2021-06-17 DIAGNOSIS — N5089 Other specified disorders of the male genital organs: Secondary | ICD-10-CM | POA: Diagnosis present

## 2021-06-17 DIAGNOSIS — C6291 Malignant neoplasm of right testis, unspecified whether descended or undescended: Secondary | ICD-10-CM | POA: Diagnosis not present

## 2021-06-17 DIAGNOSIS — Z01818 Encounter for other preprocedural examination: Secondary | ICD-10-CM

## 2021-06-17 DIAGNOSIS — Z87891 Personal history of nicotine dependence: Secondary | ICD-10-CM | POA: Insufficient documentation

## 2021-06-17 HISTORY — PX: ORCHIECTOMY: SHX2116

## 2021-06-17 LAB — CBC WITH DIFFERENTIAL/PLATELET
Abs Immature Granulocytes: 0.02 10*3/uL (ref 0.00–0.07)
Basophils Absolute: 0.1 10*3/uL (ref 0.0–0.1)
Basophils Relative: 1 %
Eosinophils Absolute: 0.1 10*3/uL (ref 0.0–0.5)
Eosinophils Relative: 2 %
HCT: 42.7 % (ref 39.0–52.0)
Hemoglobin: 15 g/dL (ref 13.0–17.0)
Immature Granulocytes: 0 %
Lymphocytes Relative: 15 %
Lymphs Abs: 0.9 10*3/uL (ref 0.7–4.0)
MCH: 33.6 pg (ref 26.0–34.0)
MCHC: 35.1 g/dL (ref 30.0–36.0)
MCV: 95.5 fL (ref 80.0–100.0)
Monocytes Absolute: 0.5 10*3/uL (ref 0.1–1.0)
Monocytes Relative: 8 %
Neutro Abs: 4.5 10*3/uL (ref 1.7–7.7)
Neutrophils Relative %: 74 %
Platelets: 255 10*3/uL (ref 150–400)
RBC: 4.47 MIL/uL (ref 4.22–5.81)
RDW: 11.8 % (ref 11.5–15.5)
WBC: 6.1 10*3/uL (ref 4.0–10.5)
nRBC: 0 % (ref 0.0–0.2)

## 2021-06-17 SURGERY — ORCHIECTOMY
Anesthesia: General | Site: Scrotum | Laterality: Right

## 2021-06-17 MED ORDER — HEMOSTATIC AGENTS (NO CHARGE) OPTIME
TOPICAL | Status: DC | PRN
  Administered 2021-06-17: 1 via TOPICAL

## 2021-06-17 MED ORDER — ORAL CARE MOUTH RINSE: 15.0000 mL | Freq: Once | OROMUCOSAL | Status: AC

## 2021-06-17 MED ORDER — FENTANYL CITRATE PF 50 MCG/ML IJ SOSY
PREFILLED_SYRINGE | INTRAMUSCULAR | Status: AC
  Filled 2021-06-17: qty 1

## 2021-06-17 MED ORDER — ONDANSETRON HCL 4 MG/2ML IJ SOLN
INTRAMUSCULAR | Status: DC | PRN
  Administered 2021-06-17: 4 mg via INTRAVENOUS

## 2021-06-17 MED ORDER — CEFAZOLIN SODIUM-DEXTROSE 2-4 GM/100ML-% IV SOLN
INTRAVENOUS | Status: AC
  Filled 2021-06-17: qty 100

## 2021-06-17 MED ORDER — ONDANSETRON HCL 4 MG/2ML IJ SOLN
INTRAMUSCULAR | Status: AC
  Filled 2021-06-17: qty 2

## 2021-06-17 MED ORDER — EPHEDRINE SULFATE 50 MG/ML IJ SOLN
INTRAMUSCULAR | Status: DC | PRN
  Administered 2021-06-17 (×2): 5 mg via INTRAVENOUS

## 2021-06-17 MED ORDER — HYDROCODONE-ACETAMINOPHEN 5-325 MG PO TABS: 1.0000 | ORAL_TABLET | ORAL | 0 refills | Status: DC | PRN

## 2021-06-17 MED ORDER — BUPIVACAINE HCL (PF) 0.25 % IJ SOLN
INTRAMUSCULAR | Status: AC
  Filled 2021-06-17: qty 30

## 2021-06-17 MED ORDER — CEFAZOLIN SODIUM-DEXTROSE 2-4 GM/100ML-% IV SOLN
2.0000 g | INTRAVENOUS | Status: AC
  Administered 2021-06-17: 2 g via INTRAVENOUS

## 2021-06-17 MED ORDER — DEXAMETHASONE SODIUM PHOSPHATE 10 MG/ML IJ SOLN
INTRAMUSCULAR | Status: DC | PRN
  Administered 2021-06-17: 10 mg via INTRAVENOUS

## 2021-06-17 MED ORDER — MIDAZOLAM HCL 2 MG/2ML IJ SOLN
INTRAMUSCULAR | Status: AC
  Filled 2021-06-17: qty 2

## 2021-06-17 MED ORDER — CHLORHEXIDINE GLUCONATE 0.12 % MT SOLN
15.0000 mL | Freq: Once | OROMUCOSAL | Status: AC
  Administered 2021-06-17: 15 mL via OROMUCOSAL

## 2021-06-17 MED ORDER — SCOPOLAMINE 1 MG/3DAYS TD PT72
MEDICATED_PATCH | TRANSDERMAL | Status: AC
  Filled 2021-06-17: qty 1

## 2021-06-17 MED ORDER — SCOPOLAMINE 1 MG/3DAYS TD PT72
1.0000 | MEDICATED_PATCH | TRANSDERMAL | Status: DC
  Administered 2021-06-17: 1.5 mg via TRANSDERMAL

## 2021-06-17 MED ORDER — PROPOFOL 10 MG/ML IV BOLUS
INTRAVENOUS | Status: DC | PRN
  Administered 2021-06-17: 250 mg via INTRAVENOUS

## 2021-06-17 MED ORDER — PROPOFOL 10 MG/ML IV BOLUS
INTRAVENOUS | Status: AC
  Filled 2021-06-17: qty 20

## 2021-06-17 MED ORDER — BUPIVACAINE HCL (PF) 0.25 % IJ SOLN
INTRAMUSCULAR | Status: DC | PRN
  Administered 2021-06-17: 10 mL

## 2021-06-17 MED ORDER — FENTANYL CITRATE PF 50 MCG/ML IJ SOSY
25.0000 ug | PREFILLED_SYRINGE | INTRAMUSCULAR | Status: DC | PRN
  Administered 2021-06-17: 50 ug via INTRAVENOUS

## 2021-06-17 MED ORDER — STERILE WATER FOR IRRIGATION IR SOLN
Status: DC | PRN
  Administered 2021-06-17: 500 mL

## 2021-06-17 MED ORDER — FENTANYL CITRATE (PF) 250 MCG/5ML IJ SOLN
INTRAMUSCULAR | Status: AC
  Filled 2021-06-17: qty 5

## 2021-06-17 MED ORDER — ONDANSETRON HCL 4 MG/2ML IJ SOLN: 4.0000 mg | Freq: Once | INTRAMUSCULAR | Status: DC | PRN

## 2021-06-17 MED ORDER — LACTATED RINGERS IV SOLN
INTRAVENOUS | Status: DC
  Administered 2021-06-17: 1000 mL via INTRAVENOUS

## 2021-06-17 MED ORDER — MIDAZOLAM HCL 2 MG/2ML IJ SOLN
1.0000 mg | INTRAMUSCULAR | Status: AC | PRN
  Administered 2021-06-17 (×2): 1 mg via INTRAVENOUS

## 2021-06-17 MED ORDER — DEXMEDETOMIDINE (PRECEDEX) IN NS 20 MCG/5ML (4 MCG/ML) IV SYRINGE
PREFILLED_SYRINGE | INTRAVENOUS | Status: DC | PRN
  Administered 2021-06-17: 8 ug via INTRAVENOUS
  Administered 2021-06-17: 12 ug via INTRAVENOUS

## 2021-06-17 MED ORDER — DEXMEDETOMIDINE (PRECEDEX) IN NS 20 MCG/5ML (4 MCG/ML) IV SYRINGE
PREFILLED_SYRINGE | INTRAVENOUS | Status: AC
  Filled 2021-06-17: qty 5

## 2021-06-17 MED ORDER — ONDANSETRON HCL 4 MG PO TABS: 4.0000 mg | ORAL_TABLET | Freq: Every day | ORAL | 1 refills | Status: AC | PRN

## 2021-06-17 MED ORDER — LIDOCAINE HCL (PF) 2 % IJ SOLN
INTRAMUSCULAR | Status: AC
  Filled 2021-06-17: qty 5

## 2021-06-17 MED ORDER — LIDOCAINE HCL (CARDIAC) PF 100 MG/5ML IV SOSY
PREFILLED_SYRINGE | INTRAVENOUS | Status: DC | PRN
  Administered 2021-06-17: 50 mg via INTRAVENOUS

## 2021-06-17 MED ORDER — DEXAMETHASONE SODIUM PHOSPHATE 10 MG/ML IJ SOLN
INTRAMUSCULAR | Status: AC
  Filled 2021-06-17: qty 1

## 2021-06-17 MED ORDER — MIDAZOLAM HCL 5 MG/5ML IJ SOLN
INTRAMUSCULAR | Status: DC | PRN
  Administered 2021-06-17: 2 mg via INTRAVENOUS

## 2021-06-17 MED ORDER — FENTANYL CITRATE (PF) 100 MCG/2ML IJ SOLN
INTRAMUSCULAR | Status: DC | PRN
  Administered 2021-06-17: 100 ug via INTRAVENOUS
  Administered 2021-06-17: 50 ug via INTRAVENOUS
  Administered 2021-06-17: 100 ug via INTRAVENOUS

## 2021-06-17 SURGICAL SUPPLY — 47 items
ADH SKN CLS APL DERMABOND .7 (GAUZE/BANDAGES/DRESSINGS) ×1
COVER LIGHT HANDLE STERIS (MISCELLANEOUS) ×4 IMPLANT
DECANTER SPIKE VIAL GLASS SM (MISCELLANEOUS) ×2 IMPLANT
DERMABOND ADVANCED (GAUZE/BANDAGES/DRESSINGS) ×1
DERMABOND ADVANCED .7 DNX12 (GAUZE/BANDAGES/DRESSINGS) ×1 IMPLANT
DRAIN PENROSE 0.5X18 (DRAIN) ×2 IMPLANT
ELECT REM PT RETURN 9FT ADLT (ELECTROSURGICAL) ×2
ELECTRODE REM PT RTRN 9FT ADLT (ELECTROSURGICAL) ×1 IMPLANT
GAUZE SPONGE 4X4 12PLY STRL (GAUZE/BANDAGES/DRESSINGS) ×2 IMPLANT
GLOVE BIOGEL PI IND STRL 8.5 (GLOVE) ×1 IMPLANT
GLOVE BIOGEL PI INDICATOR 8.5 (GLOVE) ×1
GLOVE SRG 8 PF TXTR STRL LF DI (GLOVE) ×1 IMPLANT
GLOVE SURG POLYISO LF SZ8 (GLOVE) ×2 IMPLANT
GLOVE SURG UNDER POLY LF SZ8 (GLOVE) ×2
GOWN STRL REUS W/TWL LRG LVL3 (GOWN DISPOSABLE) ×2 IMPLANT
GOWN STRL REUS W/TWL XL LVL3 (GOWN DISPOSABLE) ×2 IMPLANT
KIT TURNOVER KIT A (KITS) ×2 IMPLANT
MANIFOLD NEPTUNE II (INSTRUMENTS) IMPLANT
NDL HYPO 25X1 1.5 SAFETY (NEEDLE) ×1 IMPLANT
NEEDLE HYPO 25X1 1.5 SAFETY (NEEDLE) ×2 IMPLANT
NS IRRIG 500ML POUR BTL (IV SOLUTION) IMPLANT
PACK MINOR (CUSTOM PROCEDURE TRAY) ×2 IMPLANT
PAD ARMBOARD 7.5X6 YLW CONV (MISCELLANEOUS) ×2 IMPLANT
PENCIL SMOKE EVACUATOR (MISCELLANEOUS) ×2 IMPLANT
POWDER SURGICEL 3.0 GRAM (HEMOSTASIS) ×1 IMPLANT
SET BASIN LINEN APH (SET/KITS/TRAYS/PACK) ×2 IMPLANT
SOL PREP PROV IODINE SCRUB 4OZ (MISCELLANEOUS) ×2 IMPLANT
SPONGE GAUZE 4X4 12PLY (GAUZE/BANDAGES/DRESSINGS) ×1 IMPLANT
SUPPORT SCROTAL LG STRP (MISCELLANEOUS) ×2 IMPLANT
SUT CHROMIC 3 0 SH 27 (SUTURE) ×1 IMPLANT
SUT CHROMIC 4 0 PS 2 18 (SUTURE) ×1 IMPLANT
SUT CHROMIC GUT AB #0 18 (SUTURE) IMPLANT
SUT MNCRL AB 4-0 PS2 18 (SUTURE) ×1 IMPLANT
SUT PROLENE 4 0 RB 1 (SUTURE)
SUT PROLENE 4-0 RB1 .5 CRCL 36 (SUTURE) IMPLANT
SUT SILK 0 SH 30 (SUTURE) ×1 IMPLANT
SUT SILK 2 0 (SUTURE) ×2
SUT SILK 2-0 18XBRD TIE 12 (SUTURE) ×1 IMPLANT
SUT VIC AB 0 SH 27 (SUTURE) IMPLANT
SUT VIC AB 2-0 SH 27 (SUTURE) ×2
SUT VIC AB 2-0 SH 27X BRD (SUTURE) ×1 IMPLANT
SUT VIC AB 3-0 SH 27 (SUTURE) ×4
SUT VIC AB 3-0 SH 27X BRD (SUTURE) IMPLANT
SUT VICRYL 2 0 18  UND BR (SUTURE)
SUT VICRYL 2 0 18 UND BR (SUTURE) IMPLANT
SYR BULB IRRIG 60ML STRL (SYRINGE) ×2 IMPLANT
SYR CONTROL 10ML LL (SYRINGE) ×2 IMPLANT

## 2021-06-17 NOTE — Anesthesia Postprocedure Evaluation (Signed)
Anesthesia Post Note  Patient: Terry Huang  Procedure(s) Performed: RIGHT RADICAL ORCHIECTOMY (Right: Scrotum)  Patient location during evaluation: Phase II Anesthesia Type: General Level of consciousness: awake Pain management: pain level controlled Vital Signs Assessment: post-procedure vital signs reviewed and stable Respiratory status: spontaneous breathing and respiratory function stable Cardiovascular status: blood pressure returned to baseline and stable Postop Assessment: no headache and no apparent nausea or vomiting Anesthetic complications: no Comments: Late entry   No notable events documented.   Last Vitals:  Vitals:   06/17/21 1330 06/17/21 1347  BP: 130/76 (!) 137/93  Pulse: 67 71  Resp: 15 16  Temp:  36.7 C  SpO2: 98% 98%    Last Pain:  Vitals:   06/17/21 1347  TempSrc: Oral  PainSc: [place]

## 2021-06-17 NOTE — Interval H&P Note (Signed)
History and Physical Interval Note:  06/17/2021 11:10 AM  Barry Dienes  has presented today for surgery, with the diagnosis of right testis mass.  The various methods of treatment have been discussed with the patient and family. After consideration of risks, benefits and other options for treatment, the patient has consented to  Procedure(s): ORCHIECTOMY (Right) as a surgical intervention.  The patient's history has been reviewed, patient examined, no change in status, stable for surgery.  I have reviewed the patient's chart and labs.  Questions were answered to the patient's satisfaction.     Nicolette Bang

## 2021-06-17 NOTE — Anesthesia Preprocedure Evaluation (Signed)
Anesthesia Evaluation  Patient identified by MRN, date of birth, ID band Patient awake    Reviewed: Allergy & Precautions, H&P , NPO status , Patient's Chart, lab work & pertinent test results, reviewed documented beta blocker date and time   Airway Mallampati: II  TM Distance: >3 FB Neck ROM: full    Dental no notable dental hx.    Pulmonary neg pulmonary ROS, former smoker,    Pulmonary exam normal breath sounds clear to auscultation       Cardiovascular Exercise Tolerance: Good negative cardio ROS   Rhythm:regular Rate:Normal     Neuro/Psych negative neurological ROS  negative psych ROS   GI/Hepatic negative GI ROS, Neg liver ROS,   Endo/Other  negative endocrine ROS  Renal/GU negative Renal ROS  negative genitourinary   Musculoskeletal   Abdominal   Peds  Hematology negative hematology ROS (+)   Anesthesia Other Findings   Reproductive/Obstetrics negative OB ROS                             Anesthesia Physical Anesthesia Plan  ASA: 2  Anesthesia Plan: General and General LMA   Post-op Pain Management:    Induction:   PONV Risk Score and Plan: Ondansetron and Scopolamine patch - Pre-op  Airway Management Planned:   Additional Equipment:   Intra-op Plan:   Post-operative Plan:   Informed Consent: I have reviewed the patients History and Physical, chart, labs and discussed the procedure including the risks, benefits and alternatives for the proposed anesthesia with the patient or authorized representative who has indicated his/her understanding and acceptance.     Dental Advisory Given  Plan Discussed with: CRNA  Anesthesia Plan Comments:         Anesthesia Quick Evaluation

## 2021-06-17 NOTE — Progress Notes (Signed)
  June 17, 2021  Patient: Terry Huang  Date of Birth: 03-13-1991  Date of Visit: 06/17/2021    To Whom It May Concern:  [name] was seen and treated in our surgical department on June 17, 2021. Terry Huang  may return to work on 06/28/2021 after seen by Psychologist, sport and exercise .  Sincerely,   Sofie Rower RN

## 2021-06-17 NOTE — Anesthesia Procedure Notes (Signed)
Procedure Name: LMA Insertion Date/Time: 06/17/2021 11:24 AM Performed by: Jonna Munro, CRNA Pre-anesthesia Checklist: Patient identified, Emergency Drugs available, Suction available, Patient being monitored and Timeout performed Patient Re-evaluated:Patient Re-evaluated prior to induction Oxygen Delivery Method: Circle system utilized Preoxygenation: Pre-oxygenation with 100% oxygen Induction Type: IV induction LMA: LMA inserted LMA Size: 5.0 Number of attempts: 1 Placement Confirmation: positive ETCO2 and breath sounds checked- equal and bilateral Tube secured with: Tape Dental Injury: Teeth and Oropharynx as per pre-operative assessment

## 2021-06-17 NOTE — Transfer of Care (Signed)
Immediate Anesthesia Transfer of Care Note  Patient: Terry Huang  Procedure(s) Performed: RIGHT RADICAL ORCHIECTOMY (Right: Scrotum)  Patient Location: PACU  Anesthesia Type:General  Level of Consciousness: awake, alert , oriented and patient cooperative  Airway & Oxygen Therapy: Patient Spontanous Breathing and Patient connected to nasal cannula oxygen  Post-op Assessment: Report given to RN, Post -op Vital signs reviewed and stable and Patient moving all extremities X 4  Post vital signs: Reviewed and stable  Last Vitals:  Vitals Value Taken Time  BP    Temp    Pulse 81 06/17/21 1228  Resp 13 06/17/21 1228  SpO2 98 % 06/17/21 1228  Vitals shown include unvalidated device data.  Last Pain:  Vitals:   06/17/21 0946  TempSrc: Oral  PainSc: 0-No pain      Patients Stated Pain Goal: 7 (81/[id number])  Complications: No notable events documented.

## 2021-06-17 NOTE — Op Note (Signed)
Preoperative diagnosis: right testicular mass  Postoperative diagnosis: Same  Procedure: right radical inguinal orchiectomy  Attending: Nicolette Bang, MD  Anesthesia: General  History of blood loss: Minimal  Antibiotics: ancef  Drains: none  Specimens: right radical orchiectomy  Findings: 10 cm right testicular mass that was not adherent to the scrotal wall.  The mass was removed intact. No scrotal wall violation  Indications: Patient is a 30 year old male with a history of right testicular mass that was growing in size.  We discussed the treatment options including observation versus radical orchiectomy after discussing treatment options he decided to proceed with radical orchiectomy.   Procedure in detail: Prior to procedure consent was obtained.  Patient was brought to the operating room and a brief timeout was done to ensure correct patient, correct procedure, correct site.  General anesthesia was administered and patient was placed in supine position.  His genitalia and abdomen was then prepped and draped in usual sterile fashion.  A 6 cm incision was made in the inguinal fold.  We dissected down through the subcutaneous tissue to until we reached the spermatic cord.  The spermatic cord was identified and a Penrose drain was looped around the cord.  We then proceeded to bluntly and sharply dissected the attachments of the testicle to the scrotum.  Once this was done the testicle was then brought into the operative field.  We then proceeded to dissected the gubernaculum with electrocautery.  Once the testicle was freed from its attachments were then turned our attention the spermatic cord.  We separated the spermatic cord into 3 distinct packets.  The packets were then ligated with 0 silk ties.  We then ligated the entire cord with a 2-0 silk tie with a 5cm tail. Once this was done we then sharply cut the spermatic cord and the testicle was then sent for pathology. We then closed the  external inguinal canal with 2-0 vicryl in a running fashion.  We then inspected the scrotum in the operative bed and we noted no residual bleeding. We then placed surgicel powder in the operative bed.   We then closed the subcutaneous tissues in 2 layers with 2-0 Vicryl in a running fashion.  We then closed the skin with 4-0 Monocryl in a running fashion.  We then placed a scrotal fluff and this then concluded the procedure which was well tolerated by the patient.  Complications: None  Condition: Stable, extubated, transferred to PACU.  Plan: Patient is to be discharged home.  He is to follow up in 2 weeks for wound check and pathology discussion

## 2021-06-18 ENCOUNTER — Encounter (HOSPITAL_COMMUNITY): Payer: Self-pay | Admitting: Urology

## 2021-06-21 LAB — SURGICAL PATHOLOGY

## 2021-06-23 ENCOUNTER — Telehealth: Payer: Self-pay

## 2021-06-23 NOTE — Telephone Encounter (Signed)
Mother called office today to inquire about recent surgery pathology results.  Patient has appt this Friday 06/25/2021 to discuss results with patient at appt.  Reviewed with mother Dr. Alyson Ingles process of having patient come back to office to discuss results this coming Friday but would send Dr. Alyson Ingles a message that she called to ask about results.

## 2021-06-25 ENCOUNTER — Ambulatory Visit (INDEPENDENT_AMBULATORY_CARE_PROVIDER_SITE_OTHER): Payer: Self-pay | Admitting: Urology

## 2021-06-25 ENCOUNTER — Encounter: Payer: Self-pay | Admitting: Urology

## 2021-06-25 ENCOUNTER — Other Ambulatory Visit: Payer: Self-pay

## 2021-06-25 VITALS — BP 143/84 | HR 85

## 2021-06-25 DIAGNOSIS — C6290 Malignant neoplasm of unspecified testis, unspecified whether descended or undescended: Secondary | ICD-10-CM

## 2021-06-25 DIAGNOSIS — Z9079 Acquired absence of other genital organ(s): Secondary | ICD-10-CM

## 2021-06-25 NOTE — Patient Instructions (Signed)
Testicular Cancer Testicular cancer is the presence of a cancerous (malignant) tumor in one or both of the male sex glands that produce testosterone and sperm (testicles). A tumor is an abnormal growth of cells and tissue. Testicular cancer is the most common cancer in men ages 7-29 years old and the second most common cancer in men ages 19-63 years old. What are the causes? The cause of this condition is not known. What increases the risk? The following factors may make you more likely to develop this condition: Having had an undescended testicle. Having had abnormal development of the testicles. Having had testicular cancer in the past. Having a family history of testicular cancer, especially a father or brother. Having Klinefelter syndrome. This is a condition in which a male child shows some male characteristics. Being white (Caucasian). Having the HIV infection. What are the signs or symptoms? Symptoms of this condition include: Swelling of the scrotum. A change in how your testicle feels. A painless bump or swelling in the testicle. Dull ache or feeling of heaviness in the lower abdomen. Pain or discomfort in the testicles or scrotum. Sudden buildup of fluid in the scrotum. Growth of breast tissue. The breast area may feel painful or sore. Advanced symptoms of the condition include: Lower back pain. Shortness of breath Chest pain. A cough. Abdominal pain. Headache. Feeling confused. How is this diagnosed? This condition is diagnosed based on your medical history and a physical exam, which will include checking your testicles for lumps, swelling, or pain. You may also have tests done, including: Ultrasound of the testicles. A blood test that looks for substances that are linked to specific types of cancer (serum tumor marker test). A surgical procedure to remove the entire testicle in order to test it for cancer cells (radical inguinal orchiectomy). Imaging tests such as  X-rays, CT scan, MRI, or bone scan. If testicular cancer is confirmed, it will be staged to determine its severity and extent. Staging determines: The size of the tumor. Whether or not the cancer has spread. Where the cancer has spread. How is this treated? Once your cancer has been diagnosed and staged, you should discuss a treatment plan with your health care provider. Based on the stage of the cancer, one treatment or a combination of treatments may be recommended. The most common forms of treatment are: Surgery. Radiation therapy. Chemotherapy. High-dose chemotherapy followed by stem cell transplant. Certain treatments for testicular cancer can cause infertility, a condition that could become permanent. Talk to your health care provider about the risks of treatment and your options if you want to have children. Follow these instructions at home: Take over-the-counter and prescription medicines only as told by your health care provider. Maintain a healthy diet. Talk with your dietitian, or your health care provider, about what dietary choices are best for you. If you have to go to the hospital, let your cancer specialist know. Consider joining a support group. This may help you learn to cope with the stress of having testicular cancer. Seek advice to help you manage treatment side effects. Keep all follow-up visits. This is important. Where to find more information [employer]: www.cancer.org or 912 859 5239. [place]: www.cancer.gov or 1-800-4-CANCER. Contact a health care provider if: You have a dull ache or feeling of heaviness in your lower abdomen or groin. You have a sudden buildup of fluid in your scrotum. You have new symptoms such as headache, abdominal pain, or lower back pain. Get help right away if:  You have an increase in pain, discomfort, or swelling in your testicle or scrotum. You have a cough, shortness of breath, or chest pain. You feel  confused. Summary Testicular cancer is the presence of a cancerous (malignant) tumor in one or both of the male sex glands that produce testosterone and sperm (testicles). Once your testicular cancer has been diagnosed and staged, you should discuss a treatment plan with your health care provider. Based on the stage of the cancer, one treatment or a combination of treatments may be recommended. The most common forms of treatment are surgery, radiation therapy, chemotherapy, and high-dose chemotherapy followed by stem cell transplant. This information is not intended to replace advice given to you by your health care provider. Make sure you discuss any questions you have with your health care provider. Document Revised: 01/01/2021 Document Reviewed: 01/01/2021 Elsevier Patient Education  [place].

## 2021-06-25 NOTE — Progress Notes (Signed)
Urological Symptom Review  Patient is experiencing the following symptoms: none   Review of Systems  Gastrointestinal (upper)  : Negative for upper GI symptoms  Gastrointestinal (lower) : negative  Constitutional : Negative for symptoms  Skin: Negative for skin symptoms  Eyes: Negative for eye symptoms  Ear/Nose/Throat : Negative for Ear/Nose/Throat symptoms  Hematologic/Lymphatic: Negative for Hematologic/Lymphatic symptoms  Cardiovascular : Negative for cardiovascular symptoms  Respiratory : Negative for respiratory symptoms  Endocrine: Negative for endocrine symptoms  Musculoskeletal: Negative for musculoskeletal symptoms  Neurological: Negative for neurological symptoms  Psychologic: Depression Anxiety

## 2021-06-25 NOTE — Progress Notes (Signed)
06/25/2021 10:48 AM   Terry Huang 07-Mar-1991 [id number]  Referring provider: Coral Spikes, DO [place],  [place] [address]  Followup right orchiectomy   HPI: Terry Huang is a 30yo here for followup after right radical orchiectomy. 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 Mild right inguinal pain. No drainage from incision   PMH: Past Medical History:  Diagnosis Date   ADD (attention deficit disorder)    Cancer [facility])     Surgical History: Past Surgical History:  Procedure Laterality Date   HERNIA REPAIR     ORCHIECTOMY Right 06/17/2021   Procedure: RIGHT RADICAL ORCHIECTOMY;  Surgeon: Cleon Gustin, MD;  Location: [facility];  Service: Urology;  Laterality: Right;    Home Medications:  Allergies as of 06/25/2021   No Known Allergies      Medication List        Accurate as of June 25, 2021 10:48 AM. If you have any questions, ask your nurse or doctor.          citalopram 20 MG tablet Commonly known as: CELEXA Take 20 mg by mouth daily.   HYDROcodone-acetaminophen 5-325 MG tablet Commonly known as: Norco Take 1 tablet by mouth every 4 (four) hours as needed for moderate pain.   ibuprofen 200 MG tablet Commonly known as: ADVIL Take 400 mg by mouth every 6 (six) hours as needed for moderate pain.   omeprazole 20 MG capsule Commonly known as: PRILOSEC Take 20 mg by mouth daily as needed (heartburn).   ondansetron 4 MG tablet Commonly known as: Zofran Take 1 tablet (4 mg total) by mouth daily as needed for nausea or vomiting.        Allergies: No Known Allergies  Family History: No family history on file.  Social History:  reports that he quit smoking about 11 years ago. His smoking use included cigarettes. He has never used smokeless tobacco. He reports current alcohol use. He reports that he does not use drugs.  ROS: All other review of systems were reviewed and are negative except what is noted above in  HPI  Physical Exam: BP (!) 143/84   Pulse 85   Constitutional:  Alert and oriented, No acute distress. HEENT: [place] AT, moist mucus membranes.  Trachea midline, no masses. Cardiovascular: No clubbing, cyanosis, or edema. Respiratory: Normal respiratory effort, no increased work of breathing. GI: Abdomen is soft, nontender, nondistended, no abdominal masses GU: No CVA tenderness. Healing inguinal incision. No erythema.  Lymph: No cervical or inguinal lymphadenopathy. Skin: No rashes, bruises or suspicious lesions. Neurologic: Grossly intact, no focal deficits, moving all 4 extremities. Psychiatric: Normal mood and affect.  Laboratory Data: Lab Results  Component Value Date   WBC 6.1 06/17/2021   HGB 15.0 06/17/2021   HCT 42.7 06/17/2021   MCV 95.5 06/17/2021   PLT 255 06/17/2021    Lab Results  Component Value Date   CREATININE 0.71 10/06/2019    No results found for: PSA  No results found for: TESTOSTERONE  No results found for: HGBA1C  Urinalysis    Component Value Date/Time   APPEARANCEUR Clear 06/09/2021 1705   GLUCOSEU Negative 06/09/2021 1705   BILIRUBINUR Negative 06/09/2021 1705   PROTEINUR Negative 06/09/2021 1705   NITRITE Negative 06/09/2021 1705   LEUKOCYTESUR Negative 06/09/2021 1705    Lab Results  Component Value Date   LABMICR Comment 06/09/2021    Pertinent Imaging:  Results for orders placed in visit on 01/02/01  DG Abd  1 View  Narrative FINDINGS HISTORY: ABDOMINAL PAIN WITH OCCASIONAL NAUSEA. ABDOMEN 1 VIEW: NORMAL BOWEL GAS PATTERN WITH SMALL AMOUNT OF STOOL THROUGHOUT THE COLON.  NO DILATED BOWEL LOOPS OR SUPINE FREE AIR.  NO SUSPICIOUS CALCIFICATIONS. IMPRESSION NO ACUTE ABNORMALITIES WITHIN THE ABDOMEN.  No results found for this or any previous visit.  No results found for this or any previous visit.  No results found for this or any previous visit.  No results found for this or any previous visit.  No results found for  this or any previous visit.  No results found for this or any previous visit.  No results found for this or any previous visit.   Assessment & Plan:    1. S/P orchiectomy, seminoma -RTC 6 weeks for repeat testis tumor markers -CT Chest, abd, pelvis -referral to medical oncology - Urinalysis, Routine w reflex microscopic   No follow-ups on file.  Nicolette Bang, MD  [facility] [place]

## 2021-06-28 ENCOUNTER — Other Ambulatory Visit: Payer: Self-pay | Admitting: Urology

## 2021-06-28 ENCOUNTER — Ambulatory Visit (HOSPITAL_COMMUNITY)
Admission: RE | Admit: 2021-06-28 | Discharge: 2021-06-28 | Disposition: A | Discharge status: Home / Self Care | Payer: BC Managed Care – PPO | Source: Ambulatory Visit | Attending: Urology | Admitting: Urology

## 2021-06-28 ENCOUNTER — Ambulatory Visit (HOSPITAL_COMMUNITY): Admission: RE | Admit: 2021-06-28 | Payer: BC Managed Care – PPO | Source: Ambulatory Visit

## 2021-06-28 ENCOUNTER — Other Ambulatory Visit: Payer: Self-pay

## 2021-06-28 ENCOUNTER — Encounter (HOSPITAL_COMMUNITY): Payer: Self-pay

## 2021-06-28 DIAGNOSIS — C6290 Malignant neoplasm of unspecified testis, unspecified whether descended or undescended: Secondary | ICD-10-CM | POA: Diagnosis not present

## 2021-06-28 DIAGNOSIS — Z9079 Acquired absence of other genital organ(s): Secondary | ICD-10-CM | POA: Diagnosis present

## 2021-06-28 MED ORDER — IOHEXOL 300 MG/ML  SOLN
100.0000 mL | Freq: Once | INTRAMUSCULAR | Status: AC | PRN
  Administered 2021-06-28: 100 mL via INTRAVENOUS

## 2021-07-05 NOTE — Progress Notes (Signed)
[place] [address] [place] [address]   CLINIC:  Medical Oncology/Hematology  CONSULT NOTE  Patient Care Team: Coral Spikes, DO as PCP - General (Family Medicine) Derek Jack, MD as Medical Oncologist (Medical Oncology) Brien Mates, RN as Oncology Nurse Navigator (Oncology)  CHIEF COMPLAINTS/PURPOSE OF CONSULTATION:  Evaluation of testicular seminoma  HISTORY OF PRESENTING ILLNESS:  [name] 30 y.o. male is here because of evaluation of testicular seminoma, at the request of [facility]. He underwent right orchiectomy on 12/01 with Dr. Nicolette Bang.   Today he reports feeling good, and he is accompanied by his mother. He noticed swelling of the right testicle starting 7 months ago. He denies unintentional weight loss. He reports a cough starting over the past 2-3 week, and he denies any recent infection. He reports diarrhea and dizziness starting 2 days ago. He denies tingling/numbness in his hands and feet.   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 He vapes. His maternal uncle passed from throat cancer, his maternal aunt passed from brain and lung cancer, his paternal cousin had unspecified cancer in her leg, and his maternal grandmother had melanoma metastatic to the brain.  MEDICAL HISTORY:  Past Medical History:  Diagnosis Date   ADD (attention deficit disorder)    Cancer [facility])     SURGICAL HISTORY: Past Surgical History:  Procedure Laterality Date   HERNIA REPAIR     ORCHIECTOMY Right 06/17/2021   Procedure: RIGHT RADICAL ORCHIECTOMY;  Surgeon: Cleon Gustin, MD;  Location: [facility];  Service: Urology;  Laterality: Right;    SOCIAL HISTORY: Social History   Socioeconomic History   Marital status: Single    Spouse name: Not on file   Number of children: Not on file   Years of education: Not on file   Highest education level: Not on file  Occupational History   Not on file  Tobacco Use    Smoking status: Former    Years: 1.00    Types: Cigarettes    Quit date: 06/08/2010    Years since quitting: 11.0   Smokeless tobacco: Never   Tobacco comments:    pt smokes e-cigs  Vaping Use   Vaping Use: Every day   Substances: Nicotine, Flavoring  Substance and Sexual Activity   Alcohol use: Yes    Comment: socially    Drug use: No   Sexual activity: Yes  Other Topics Concern   Not on file  Social History Narrative   Not on file   Social Determinants of Health   Financial Resource Strain: Not on file  Food Insecurity: Not on file  Transportation Needs: Not on file  Physical Activity: Not on file  Stress: Not on file  Social Connections: Not on file  Intimate Partner Violence: Not on file    FAMILY HISTORY: No family history on file.  ALLERGIES:  has No Known Allergies.  MEDICATIONS:  Current Outpatient Medications  Medication Sig Dispense Refill   citalopram (CELEXA) 20 MG tablet Take 20 mg by mouth daily.     HYDROcodone-acetaminophen (NORCO) 5-325 MG tablet Take 1 tablet by mouth every 4 (four) hours as needed for moderate pain. (Patient not taking: Reported on 07/06/2021) 30 tablet 0   ibuprofen (ADVIL) 200 MG tablet Take 400 mg by mouth every 6 (six) hours as needed for moderate pain. (Patient not taking: Reported on 07/06/2021)     omeprazole (PRILOSEC) 20 MG capsule Take 20 mg  by mouth daily as needed (heartburn). (Patient not taking: Reported on 07/06/2021)     ondansetron (ZOFRAN) 4 MG tablet Take 1 tablet (4 mg total) by mouth daily as needed for nausea or vomiting. (Patient not taking: Reported on 07/06/2021) 30 tablet 1   No current facility-administered medications for this visit.    REVIEW OF SYSTEMS:   Review of Systems  Constitutional:  Negative for appetite change (80%), fatigue (70%) and unexpected weight change.  Respiratory:  Positive for cough.   Gastrointestinal:  Positive for diarrhea.  Neurological:  Positive for dizziness. Negative  for numbness.  Psychiatric/Behavioral:  Positive for depression. The patient is nervous/anxious.   All other systems reviewed and are negative.   PHYSICAL EXAMINATION: ECOG PERFORMANCE STATUS: 0 - Asymptomatic  Vitals:   07/06/21 0807  Pulse: (!) 105  Resp: 18  Temp: (!) 96.6 F (35.9 C)  SpO2: 98%   Filed Weights   07/06/21 0807  Weight: 164 lb 6.4 oz (74.6 kg)   Physical Exam Vitals reviewed.  Constitutional:      Appearance: Normal appearance.  Cardiovascular:     Rate and Rhythm: Normal rate and regular rhythm.     Pulses: Normal pulses.     Heart sounds: Normal heart sounds.  Pulmonary:     Effort: Pulmonary effort is normal.     Breath sounds: Normal breath sounds.  Abdominal:     Palpations: Abdomen is soft. There is no hepatomegaly, splenomegaly or mass.     Tenderness: There is no abdominal tenderness.  Musculoskeletal:     Right lower leg: No edema.     Left lower leg: No edema.  Lymphadenopathy:     Lower Body: No right inguinal adenopathy. No left inguinal adenopathy.  Neurological:     General: No focal deficit present.     Mental Status: He is alert and oriented to person, place, and time.  Psychiatric:        Mood and Affect: Mood normal.        Behavior: Behavior normal.     LABORATORY DATA:  I have reviewed the data as listed CBC Latest Ref Rng & Units 06/17/2021 10/06/2019  WBC 4.0 - 10.5 K/uL 6.1 7.3  Hemoglobin 13.0 - 17.0 g/dL 15.0 16.4  Hematocrit 39.0 - 52.0 % 42.7 46.2  Platelets 150 - 400 K/uL 255 383   CMP Latest Ref Rng & Units 10/06/2019  Glucose 70 - 99 mg/dL 102(H)  BUN 6 - 20 mg/dL 6  Creatinine 0.61 - 1.24 mg/dL 0.71  Sodium 135 - 145 mmol/L 135  Potassium 3.5 - 5.1 mmol/L 3.3(L)  Chloride 98 - 111 mmol/L 100  CO2 22 - 32 mmol/L 22  Calcium 8.9 - 10.3 mg/dL 8.6(L)  Total Protein 6.5 - 8.1 g/dL 8.3(H)  Total Bilirubin 0.3 - 1.2 mg/dL 0.9  Alkaline Phos 38 - 126 U/L 82  AST 15 - 41 U/L 62(H)  ALT 0 - 44 U/L 54(H)     RADIOGRAPHIC STUDIES: I have personally reviewed the radiological images as listed and agreed with the findings in the report. CT Abdomen Pelvis W Wo Contrast  Result Date: 06/28/2021 CLINICAL DATA:  Testicular cancer staging in a 30 year old male. EXAM: CT ABDOMEN AND PELVIS WITHOUT AND CHEST, ABDOMEN, AND PELVIS WITH CONTRAST TECHNIQUE: Multidetector CT imaging of the abdomen and pelvis was performed without intravenous contrast. Subsequently, multi detector CT imaging of the chest, abdomen and pelvis was performed following the standard protocol during bolus administration of intravenous contrast.  CONTRAST:  136mL OMNIPAQUE IOHEXOL 300 MG/ML  SOLN COMPARISON:  This ticket ower sonogram of November of 2022. FINDINGS: CT CHEST FINDINGS Cardiovascular: Normal appearance of the heart great vessels. No pericardial effusion. Mediastinum/Nodes: No thoracic inlet, axillary, mediastinal or hilar adenopathy. Esophagus grossly normal. Lungs/Pleura: Subtle ground-glass at the RIGHT lung base with mildly nodular features 2.5 x 2.4 cm (image 129/8) no discrete pulmonary nodule. No lobar consolidation. No sign of pleural effusion. Airways are patent. Musculoskeletal: See below for full musculoskeletal details. CT ABDOMEN PELVIS FINDINGS Hepatobiliary: Question mild hepatic steatosis, tiny low-density lesion in the RIGHT hepatic lobe likely a small cyst but too small for definitive characterization at 4 mm. Portal vein is patent. No pericholecystic stranding. No biliary duct distension. Pancreas: Normal, without mass, inflammation or ductal dilatation. Spleen: Spleen normal size and contour. Adrenals/Urinary Tract: Subtle low-attenuation area in the upper pole the LEFT kidney laterally measuring 10 mm. No perivesical stranding. No hydronephrosis. Greater than 70 Hounsfield units on precontrast imaging compatible with a small hemorrhagic cyst Stomach/Bowel: No acute gastrointestinal process.  Normal appendix.  Vascular/Lymphatic: Aorta with smooth contours. IVC with smooth contours. No aneurysmal dilation of the abdominal aorta. There is no gastrohepatic or hepatoduodenal ligament lymphadenopathy. No retroperitoneal or mesenteric lymphadenopathy. No pelvic sidewall lymphadenopathy. Reproductive: Post RIGHT orchiectomy. Fluid collection in the RIGHT inguinal canal with mild surrounding stranding, tracks into the RIGHT scrotum however measuring 6.6 x 3.5 x 12.0 cm. Mild peripheral enhancement seen along the inferior margin as it enters the scrotum. Mild scrotal wall thickening. Other: No ascites. Musculoskeletal: No acute musculoskeletal process. No destructive bone finding. Signs of an incompletely united RIGHT clavicular fracture with callus formation. No suspicious bony lesion. IMPRESSION: Post RIGHT orchiectomy with fluid collection in the RIGHT inguinal canal tracking into the scrotum, more likely postoperative change/seroma. Would however suggest correlation with any symptoms that would suggest infection. Collection is moderately large. No evidence of metastatic disease in the chest, abdomen or pelvis. Tiny hypodense lesion in the RIGHT hepatic lobe more likely a small cyst. Given patient history consider short interval follow-up with MRI in 3-6 months. Subtle ground-glass at the RIGHT lung base nonspecific, may reflect mild pneumonitis, attention on subsequent imaging. Hemorrhagic cyst of the LEFT kidney. Mild hepatic steatosis. These results will be called to the ordering clinician or representative by the Radiologist Assistant, and communication documented in the PACS or [employer]. Electronically Signed   By: Zetta Bills M.D.   On: 06/28/2021 17:20   CT CHEST W CONTRAST  Result Date: 06/28/2021 CLINICAL DATA:  Testicular cancer staging in a 30 year old male. EXAM: CT ABDOMEN AND PELVIS WITHOUT AND CHEST, ABDOMEN, AND PELVIS WITH CONTRAST TECHNIQUE: Multidetector CT imaging of the abdomen and pelvis  was performed without intravenous contrast. Subsequently, multi detector CT imaging of the chest, abdomen and pelvis was performed following the standard protocol during bolus administration of intravenous contrast. CONTRAST:  128mL OMNIPAQUE IOHEXOL 300 MG/ML  SOLN COMPARISON:  This ticket ower sonogram of November of 2022. FINDINGS: CT CHEST FINDINGS Cardiovascular: Normal appearance of the heart great vessels. No pericardial effusion. Mediastinum/Nodes: No thoracic inlet, axillary, mediastinal or hilar adenopathy. Esophagus grossly normal. Lungs/Pleura: Subtle ground-glass at the RIGHT lung base with mildly nodular features 2.5 x 2.4 cm (image 129/8) no discrete pulmonary nodule. No lobar consolidation. No sign of pleural effusion. Airways are patent. Musculoskeletal: See below for full musculoskeletal details. CT ABDOMEN PELVIS FINDINGS Hepatobiliary: Question mild hepatic steatosis, tiny low-density lesion in the RIGHT hepatic lobe likely a  small cyst but too small for definitive characterization at 4 mm. Portal vein is patent. No pericholecystic stranding. No biliary duct distension. Pancreas: Normal, without mass, inflammation or ductal dilatation. Spleen: Spleen normal size and contour. Adrenals/Urinary Tract: Subtle low-attenuation area in the upper pole the LEFT kidney laterally measuring 10 mm. No perivesical stranding. No hydronephrosis. Greater than 70 Hounsfield units on precontrast imaging compatible with a small hemorrhagic cyst Stomach/Bowel: No acute gastrointestinal process.  Normal appendix. Vascular/Lymphatic: Aorta with smooth contours. IVC with smooth contours. No aneurysmal dilation of the abdominal aorta. There is no gastrohepatic or hepatoduodenal ligament lymphadenopathy. No retroperitoneal or mesenteric lymphadenopathy. No pelvic sidewall lymphadenopathy. Reproductive: Post RIGHT orchiectomy. Fluid collection in the RIGHT inguinal canal with mild surrounding stranding, tracks into the  RIGHT scrotum however measuring 6.6 x 3.5 x 12.0 cm. Mild peripheral enhancement seen along the inferior margin as it enters the scrotum. Mild scrotal wall thickening. Other: No ascites. Musculoskeletal: No acute musculoskeletal process. No destructive bone finding. Signs of an incompletely united RIGHT clavicular fracture with callus formation. No suspicious bony lesion. IMPRESSION: Post RIGHT orchiectomy with fluid collection in the RIGHT inguinal canal tracking into the scrotum, more likely postoperative change/seroma. Would however suggest correlation with any symptoms that would suggest infection. Collection is moderately large. No evidence of metastatic disease in the chest, abdomen or pelvis. Tiny hypodense lesion in the RIGHT hepatic lobe more likely a small cyst. Given patient history consider short interval follow-up with MRI in 3-6 months. Subtle ground-glass at the RIGHT lung base nonspecific, may reflect mild pneumonitis, attention on subsequent imaging. Hemorrhagic cyst of the LEFT kidney. Mild hepatic steatosis. These results will be called to the ordering clinician or representative by the Radiologist Assistant, and communication documented in the PACS or [employer]. Electronically Signed   By: Zetta Bills M.D.   On: 06/28/2021 17:20   US SCROTUM W/DOPPLER  Result Date: 06/08/2021 CLINICAL DATA:  RIGHT scrotal swelling and redness for 6 months EXAM: SCROTAL ULTRASOUND DOPPLER ULTRASOUND OF THE TESTICLES TECHNIQUE: Complete ultrasound examination of the testicles, epididymis, and other scrotal structures was performed. Color and spectral Doppler ultrasound were also utilized to evaluate blood flow to the testicles. COMPARISON:  None FINDINGS: Right testicle Measurements: 8.0 x 4.6 x 6.7 cm. Abnormal appearance, containing a multilobulated heterogeneous soft tissue mass which demonstrates internal blood flow on color Doppler imaging. Mass replaces the RIGHT testis. Finding is consistent  with a RIGHT testicular neoplasm. Left testicle Measurements: 5.5 x 2.8 x 3.1 cm. Normal echogenicity without mass or calcification. Internal blood flow present on color Doppler imaging. Right epididymis:  Normal in size and appearance. Left epididymis:  Normal in size and appearance. Hydrocele:  None visualized. Varicocele:  None visualized. Pulsed Doppler interrogation of both testes demonstrates normal low resistance arterial and venous waveforms bilaterally. IMPRESSION: 8.0 x 4.6 x 6.7 cm diameter multilobulated soft tissue mass replacing the RIGHT testis consistent with a RIGHT testicular neoplasm. Normal appearing LEFT testis. These results will be called to the ordering clinician or representative by the Radiologist Assistant, and communication documented in the PACS or [employer]. Electronically Signed   By: Lavonia Dana M.D.   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 - Right radical inguinal orchiectomy by Dr. Alyson Ingles on 06/17/2021. - CT CAP on 06/28/2021 with tiny hypodense right hepatic lobe lesion measuring 4 to 5 mm.  Subtle  groundglass at the right lung base nonspecific.  Hemorrhagic cyst of the left kidney.  Mild hepatic steatosis.  No other evidence of metastatic disease in the chest, abdomen or pelvis. - Pathology consistent with 7.1 cm seminoma, tumor limited to the testis, margins negative, pT2 Nx. - Pre-orchiectomy tumor markers on 06/09/2021, AFP 4.1 (0-5 0.7), beta-hCG 8 (0-3), LDH 392 (121-224).   Social/family history: - He is seen with his mother today.  He works at [employer].  He vapes but does not smoke cigarettes. - Maternal aunt had metastatic lung cancer.  Maternal uncle died of metastatic throat cancer.  Paternal cousin had cancer.  Maternal grandmother died of melanoma.   PLAN:  Stage IS (T2 NX M0 S2) right testicular seminoma: - We reviewed pathology  and CT scan reports in detail. - Recommend MRI of the abdomen with and without contrast to further evaluate liver lesion. - We discussed various options including surveillance, single agent carboplatin if there is no evidence of metastatic disease based on NCCN guidelines. - Recommend repeating tumor markers in 1 week   All questions were answered. The patient knows to call the clinic with any problems, questions or concerns.   Derek Jack, MD, 07/06/21 8:43 AM  [place] [phone]   I, Thana Ates, am acting as a scribe for Dr. Derek Jack.  I, Derek Jack MD, have reviewed the above documentation for accuracy and completeness, and I agree with the above.

## 2021-07-06 ENCOUNTER — Encounter: Payer: Self-pay | Admitting: Nurse Practitioner

## 2021-07-06 ENCOUNTER — Inpatient Hospital Stay (HOSPITAL_COMMUNITY): Payer: BC Managed Care – PPO | Attending: Hematology | Admitting: Hematology

## 2021-07-06 ENCOUNTER — Ambulatory Visit (INDEPENDENT_AMBULATORY_CARE_PROVIDER_SITE_OTHER): Payer: Self-pay | Admitting: Nurse Practitioner

## 2021-07-06 ENCOUNTER — Encounter (HOSPITAL_COMMUNITY): Payer: Self-pay

## 2021-07-06 ENCOUNTER — Other Ambulatory Visit: Payer: Self-pay

## 2021-07-06 ENCOUNTER — Other Ambulatory Visit (HOSPITAL_COMMUNITY): Payer: Self-pay

## 2021-07-06 VITALS — BP 148/100 | HR 80 | Temp 98.1°F | Ht 71.0 in | Wt 165.0 lb

## 2021-07-06 DIAGNOSIS — R932 Abnormal findings on diagnostic imaging of liver and biliary tract: Secondary | ICD-10-CM | POA: Diagnosis not present

## 2021-07-06 DIAGNOSIS — F17200 Nicotine dependence, unspecified, uncomplicated: Secondary | ICD-10-CM | POA: Diagnosis not present

## 2021-07-06 DIAGNOSIS — C6211 Malignant neoplasm of descended right testis: Secondary | ICD-10-CM | POA: Insufficient documentation

## 2021-07-06 DIAGNOSIS — I10 Essential (primary) hypertension: Secondary | ICD-10-CM | POA: Insufficient documentation

## 2021-07-06 DIAGNOSIS — R222 Localized swelling, mass and lump, trunk: Secondary | ICD-10-CM

## 2021-07-06 DIAGNOSIS — Z801 Family history of malignant neoplasm of trachea, bronchus and lung: Secondary | ICD-10-CM

## 2021-07-06 DIAGNOSIS — Z808 Family history of malignant neoplasm of other organs or systems: Secondary | ICD-10-CM

## 2021-07-06 DIAGNOSIS — K769 Liver disease, unspecified: Secondary | ICD-10-CM

## 2021-07-06 MED ORDER — LOSARTAN POTASSIUM 25 MG PO TABS: 25.0000 mg | ORAL_TABLET | Freq: Every day | ORAL | 0 refills | Status: DC

## 2021-07-06 NOTE — Patient Instructions (Addendum)
[place] at [facility] Discharge Instructions  You were seen and examined today by Dr. Delton Coombes. Dr. Delton Coombes is a medical oncologist, meaning that he specializes in the management of cancer diagnoses. Dr. Delton Coombes discussed your past medical history, family history of cancers, and the events that led to you being here today.  You have been diagnosed with Seminoma, a common type of testicular cancer. Your recent CT scan revealed a potential cyst on your liver. Dr. Delton Coombes has recommended an MRI to ensure that there is nothing concerning for cancer spread.  Follow-up with Dr. Delton Coombes following your MRI.  If there is nothing concerning on the liver and the tumor markers have decreased, the recommended treatment will be observation only.   Thank you for choosing [place] at [facility] to provide your oncology and hematology care.  To afford each patient quality time with our provider, please arrive at least 15 minutes before your scheduled appointment time.   If you have a lab appointment with the [facility] please come in thru the Main Entrance and check in at the main information desk.  You need to re-schedule your appointment should you arrive 10 or more minutes late.  We strive to give you quality time with our providers, and arriving late affects you and other patients whose appointments are after yours.  Also, if you no show three or more times for appointments you may be dismissed from the clinic at the providers discretion.     Again, thank you for choosing [facility].  Our hope is that these requests will decrease the amount of time that you wait before being seen by our physicians.       _____________________________________________________________  Should you have questions after your visit to [facility], please contact our office at [phone] and follow the prompts.  Our office hours are  8:00 a.m. and 4:30 p.m. Monday - Friday.  Please note that voicemails left after 4:00 p.m. may not be returned until the following business day.  We are closed weekends and major holidays.  You do have access to a nurse 24-7, just call the main number to the clinic [phone] and do not press any options, hold on the line and a nurse will answer the phone.    For prescription refill requests, have your pharmacy contact our office and allow 72 hours.    Due to Covid, you will need to wear a mask upon entering the hospital. If you do not have a mask, a mask will be given to you at the Main Entrance upon arrival. For doctor visits, patients may have 1 support person age 42 or older with them. For treatment visits, patients can not have anyone with them due to social distancing guidelines and our immunocompromised population.

## 2021-07-06 NOTE — Progress Notes (Addendum)
° °  Subjective:    Patient ID: Terry Huang, male    DOB: 1991/02/24, 30 y.o.   MRN: [id number]  HPI  High BP Patient here for follow up on blood pressure. BP during last visit was 149/98. It was assumed that the BP reading was an isolated incidence at that time. However, patient reports having high BP regularly since then. BP as high 170s/90s. Patient denies any vision changes, chest pain, chest tightness, headaches, or SOB.   Lump on back Patient reports having a non-tender bump to near his right shoulder blade. Patient unsure of how long the bump has been there. Patient denies pain to the area and states that it does not bother him at all. He showed the area to his mother and his mother want to be sure that the area was evaluated.     Review of Systems  Constitutional:  Negative for chills, fatigue and fever.  Respiratory:  Negative for cough, chest tightness, shortness of breath and wheezing.   Gastrointestinal:  Negative for abdominal distention.  Neurological:  Negative for facial asymmetry, speech difficulty, weakness, light-headedness and headaches.      Objective:   Physical Exam Constitutional:      General: He is not in acute distress.    Appearance: Normal appearance. He is normal weight. He is not ill-appearing or toxic-appearing.  HENT:     Head: Normocephalic.  Cardiovascular:     Rate and Rhythm: Normal rate and regular rhythm.     Pulses: Normal pulses.     Heart sounds: Normal heart sounds. No murmur heard. Pulmonary:     Effort: Pulmonary effort is normal. No respiratory distress.     Breath sounds: Normal breath sounds. No wheezing.  Abdominal:     General: Abdomen is flat.  Musculoskeletal:        General: Normal range of motion.  Skin:    General: Skin is warm.       Neurological:     General: No focal deficit present.     Mental Status: He is alert and oriented to person, place, and time.  Psychiatric:        Mood and Affect: Mood normal.         Behavior: Behavior normal.          Assessment & Plan:   1. Primary hypertension - Patient started on Losartan 25mg  daily, 45 day supply. - BP 148/100 and 146/100 on recheck. Goal BP = 130/80 - Patient given BP log and instructed to log Bps and report Bps to me via telephone or in [place] in 2 weeks. Will determine at that time if patient should continue on 25mg  daily or if he should increase to 50mg  daily. (Attempting to decrease patient visits where possible since patient has multiple follow ups with urology). - RTC in 4 weeks for BP check. - Patient educated on importance of good blood pressures.  - Discussed decreased ETOH consumption and stress management. - Have labs (lipid panel and CMP) drawn in 2 weeks (Patient notified at 8:27am on 07/07/2021).    2. Lump of skin of back - Possible subcutaneous cyst vs lipoma - Offered referral to Dermatology. Patient would like to discuss option with his mother prior to making a decision. Patient to contact provider via MyChart if he would like a referral to Dermatology.

## 2021-07-07 ENCOUNTER — Encounter (HOSPITAL_COMMUNITY): Payer: Self-pay

## 2021-07-07 ENCOUNTER — Ambulatory Visit (HOSPITAL_COMMUNITY)
Admission: RE | Admit: 2021-07-07 | Discharge: 2021-07-07 | Disposition: A | Discharge status: Home / Self Care | Payer: BC Managed Care – PPO | Source: Ambulatory Visit | Attending: Hematology | Admitting: Hematology

## 2021-07-07 ENCOUNTER — Inpatient Hospital Stay (HOSPITAL_COMMUNITY): Payer: BC Managed Care – PPO

## 2021-07-07 DIAGNOSIS — C6211 Malignant neoplasm of descended right testis: Secondary | ICD-10-CM

## 2021-07-07 DIAGNOSIS — K769 Liver disease, unspecified: Secondary | ICD-10-CM | POA: Diagnosis present

## 2021-07-07 LAB — LACTATE DEHYDROGENASE: LDH: 136 U/L (ref 98–192)

## 2021-07-07 MED ORDER — GADOBUTROL 1 MMOL/ML IV SOLN
7.0000 mL | Freq: Once | INTRAVENOUS | Status: AC | PRN
  Administered 2021-07-07: 16:00:00 7 mL via INTRAVENOUS

## 2021-07-07 NOTE — Progress Notes (Signed)
I met with the patient and his mother during and following visit with Dr. Delton Coombes. I introduced myself and explained my role in the patient's care. I provided my contact information and encouraged the patient and family to call with questions or concerns.

## 2021-07-07 NOTE — Addendum Note (Signed)
Addended by: Claire Shown on: 07/07/2021 08:29 AM   Modules accepted: Orders

## 2021-07-08 LAB — AFP TUMOR MARKER: AFP, Serum, Tumor Marker: 3.8 ng/mL (ref 0.0–5.7)

## 2021-07-08 LAB — BETA HCG QUANT (REF LAB): hCG Quant: <1 m[IU]/mL (ref 0–3)

## 2021-07-12 NOTE — Progress Notes (Addendum)
[place], [place] [address]   CLINIC:  Medical Oncology/Hematology  PCP:  Coral Spikes, DO [address] [place] [address] [phone]   REASON FOR VISIT:  Follow-up for testicular seminoma  PRIOR THERAPY: none  NGS Results: not done  CURRENT THERAPY: under work-up  BRIEF ONCOLOGIC HISTORY:  Oncology History   No history exists.    CANCER STAGING:  Cancer Staging  Seminoma of descended right testis [facility]) Staging form: Testis, AJCC 7th Edition - Clinical stage from 07/06/2021: Stage Unknown (pT2, NX, M0, S2) - Unsigned   INTERVAL HISTORY:  [name], a 30 y.o. male, returns for routine follow-up of his testicular seminoma. Terry Huang was last seen on 07/06/2021.   Today he reports feeling good, and he is accompanied by his mother.   REVIEW OF SYSTEMS:  Review of Systems  Constitutional:  Negative for appetite change and fatigue (75%).  Gastrointestinal:  Positive for diarrhea.  All other systems reviewed and are negative.  PAST MEDICAL/SURGICAL HISTORY:  Past Medical History:  Diagnosis Date   ADD (attention deficit disorder)    Cancer [facility])    Past Surgical History:  Procedure Laterality Date   HERNIA REPAIR     ORCHIECTOMY Right 06/17/2021   Procedure: RIGHT RADICAL ORCHIECTOMY;  Surgeon: Cleon Gustin, MD;  Location: [facility];  Service: Urology;  Laterality: Right;    SOCIAL HISTORY:  Social History   Socioeconomic History   Marital status: Single    Spouse name: Not on file   Number of children: Not on file   Years of education: Not on file   Highest education level: Not on file  Occupational History   Not on file  Tobacco Use   Smoking status: Former    Years: 1.00    Types: Cigarettes    Quit date: 06/08/2010    Years since quitting: 11.1   Smokeless tobacco: Never   Tobacco comments:    pt smokes e-cigs  Vaping Use   Vaping Use: Every day   Substances: Nicotine, Flavoring   Substance and Sexual Activity   Alcohol use: Yes    Comment: socially    Drug use: No   Sexual activity: Yes  Other Topics Concern   Not on file  Social History Narrative   Not on file   Social Determinants of Health   Financial Resource Strain: Not on file  Food Insecurity: Not on file  Transportation Needs: Not on file  Physical Activity: Not on file  Stress: Not on file  Social Connections: Not on file  Intimate Partner Violence: Not on file    FAMILY HISTORY:  No family history on file.  CURRENT MEDICATIONS:  Current Outpatient Medications  Medication Sig Dispense Refill   citalopram (CELEXA) 20 MG tablet Take 20 mg by mouth daily.     losartan (COZAAR) 25 MG tablet Take 1 tablet (25 mg total) by mouth daily. 45 tablet 0   omeprazole (PRILOSEC) 20 MG capsule Take 20 mg by mouth daily as needed (heartburn).     HYDROcodone-acetaminophen (NORCO) 5-325 MG tablet Take 1 tablet by mouth every 4 (four) hours as needed for moderate pain. (Patient not taking: Reported on 07/13/2021) 30 tablet 0   ibuprofen (ADVIL) 200 MG tablet Take 400 mg by mouth every 6 (six) hours as needed for moderate pain. (Patient not taking: Reported on 07/13/2021)     ondansetron (ZOFRAN) 4 MG tablet Take 1 tablet (  4 mg total) by mouth daily as needed for nausea or vomiting. (Patient not taking: Reported on 07/13/2021) 30 tablet 1   No current facility-administered medications for this visit.    ALLERGIES:  No Known Allergies  PHYSICAL EXAM:  Performance status (ECOG): 0 - Asymptomatic  Vitals:   07/13/21 0937  BP: (!) 144/107  Pulse: 78  Resp: 18  Temp: (!) 96.7 F (35.9 C)  SpO2: 100%   Wt Readings from Last 3 Encounters:  07/13/21 165 lb 9.6 oz (75.1 kg)  07/06/21 165 lb (74.8 kg)  07/06/21 164 lb 6.4 oz (74.6 kg)   Physical Exam Vitals reviewed.  Constitutional:      Appearance: Normal appearance.  Cardiovascular:     Rate and Rhythm: Normal rate and regular rhythm.      Pulses: Normal pulses.     Heart sounds: Normal heart sounds.  Pulmonary:     Effort: Pulmonary effort is normal.     Breath sounds: Normal breath sounds.  Neurological:     General: No focal deficit present.     Mental Status: He is alert and oriented to person, place, and time.  Psychiatric:        Mood and Affect: Mood normal.        Behavior: Behavior normal.     LABORATORY DATA:  I have reviewed the labs as listed.  CBC Latest Ref Rng & Units 06/17/2021 10/06/2019  WBC 4.0 - 10.5 K/uL 6.1 7.3  Hemoglobin 13.0 - 17.0 g/dL 15.0 16.4  Hematocrit 39.0 - 52.0 % 42.7 46.2  Platelets 150 - 400 K/uL 255 383   CMP Latest Ref Rng & Units 10/06/2019  Glucose 70 - 99 mg/dL 102(H)  BUN 6 - 20 mg/dL 6  Creatinine 0.61 - 1.24 mg/dL 0.71  Sodium 135 - 145 mmol/L 135  Potassium 3.5 - 5.1 mmol/L 3.3(L)  Chloride 98 - 111 mmol/L 100  CO2 22 - 32 mmol/L 22  Calcium 8.9 - 10.3 mg/dL 8.6(L)  Total Protein 6.5 - 8.1 g/dL 8.3(H)  Total Bilirubin 0.3 - 1.2 mg/dL 0.9  Alkaline Phos 38 - 126 U/L 82  AST 15 - 41 U/L 62(H)  ALT 0 - 44 U/L 54(H)    DIAGNOSTIC IMAGING:  I have independently reviewed the scans and discussed with the patient. CT Abdomen Pelvis W Wo Contrast  Result Date: 06/28/2021 CLINICAL DATA:  Testicular cancer staging in a 30 year old male. EXAM: CT ABDOMEN AND PELVIS WITHOUT AND CHEST, ABDOMEN, AND PELVIS WITH CONTRAST TECHNIQUE: Multidetector CT imaging of the abdomen and pelvis was performed without intravenous contrast. Subsequently, multi detector CT imaging of the chest, abdomen and pelvis was performed following the standard protocol during bolus administration of intravenous contrast. CONTRAST:  128mL OMNIPAQUE IOHEXOL 300 MG/ML  SOLN COMPARISON:  This ticket ower sonogram of November of 2022. FINDINGS: CT CHEST FINDINGS Cardiovascular: Normal appearance of the heart great vessels. No pericardial effusion. Mediastinum/Nodes: No thoracic inlet, axillary, mediastinal or hilar  adenopathy. Esophagus grossly normal. Lungs/Pleura: Subtle ground-glass at the RIGHT lung base with mildly nodular features 2.5 x 2.4 cm (image 129/8) no discrete pulmonary nodule. No lobar consolidation. No sign of pleural effusion. Airways are patent. Musculoskeletal: See below for full musculoskeletal details. CT ABDOMEN PELVIS FINDINGS Hepatobiliary: Question mild hepatic steatosis, tiny low-density lesion in the RIGHT hepatic lobe likely a small cyst but too small for definitive characterization at 4 mm. Portal vein is patent. No pericholecystic stranding. No biliary duct distension. Pancreas: Normal, without mass, inflammation  or ductal dilatation. Spleen: Spleen normal size and contour. Adrenals/Urinary Tract: Subtle low-attenuation area in the upper pole the LEFT kidney laterally measuring 10 mm. No perivesical stranding. No hydronephrosis. Greater than 70 Hounsfield units on precontrast imaging compatible with a small hemorrhagic cyst Stomach/Bowel: No acute gastrointestinal process.  Normal appendix. Vascular/Lymphatic: Aorta with smooth contours. IVC with smooth contours. No aneurysmal dilation of the abdominal aorta. There is no gastrohepatic or hepatoduodenal ligament lymphadenopathy. No retroperitoneal or mesenteric lymphadenopathy. No pelvic sidewall lymphadenopathy. Reproductive: Post RIGHT orchiectomy. Fluid collection in the RIGHT inguinal canal with mild surrounding stranding, tracks into the RIGHT scrotum however measuring 6.6 x 3.5 x 12.0 cm. Mild peripheral enhancement seen along the inferior margin as it enters the scrotum. Mild scrotal wall thickening. Other: No ascites. Musculoskeletal: No acute musculoskeletal process. No destructive bone finding. Signs of an incompletely united RIGHT clavicular fracture with callus formation. No suspicious bony lesion. IMPRESSION: Post RIGHT orchiectomy with fluid collection in the RIGHT inguinal canal tracking into the scrotum, more likely postoperative  change/seroma. Would however suggest correlation with any symptoms that would suggest infection. Collection is moderately large. No evidence of metastatic disease in the chest, abdomen or pelvis. Tiny hypodense lesion in the RIGHT hepatic lobe more likely a small cyst. Given patient history consider short interval follow-up with MRI in 3-6 months. Subtle ground-glass at the RIGHT lung base nonspecific, may reflect mild pneumonitis, attention on subsequent imaging. Hemorrhagic cyst of the LEFT kidney. Mild hepatic steatosis. These results will be called to the ordering clinician or representative by the Radiologist Assistant, and communication documented in the PACS or [employer]. Electronically Signed   By: Zetta Bills M.D.   On: 06/28/2021 17:20   CT CHEST W CONTRAST  Result Date: 06/28/2021 CLINICAL DATA:  Testicular cancer staging in a 30 year old male. EXAM: CT ABDOMEN AND PELVIS WITHOUT AND CHEST, ABDOMEN, AND PELVIS WITH CONTRAST TECHNIQUE: Multidetector CT imaging of the abdomen and pelvis was performed without intravenous contrast. Subsequently, multi detector CT imaging of the chest, abdomen and pelvis was performed following the standard protocol during bolus administration of intravenous contrast. CONTRAST:  164mL OMNIPAQUE IOHEXOL 300 MG/ML  SOLN COMPARISON:  This ticket ower sonogram of November of 2022. FINDINGS: CT CHEST FINDINGS Cardiovascular: Normal appearance of the heart great vessels. No pericardial effusion. Mediastinum/Nodes: No thoracic inlet, axillary, mediastinal or hilar adenopathy. Esophagus grossly normal. Lungs/Pleura: Subtle ground-glass at the RIGHT lung base with mildly nodular features 2.5 x 2.4 cm (image 129/8) no discrete pulmonary nodule. No lobar consolidation. No sign of pleural effusion. Airways are patent. Musculoskeletal: See below for full musculoskeletal details. CT ABDOMEN PELVIS FINDINGS Hepatobiliary: Question mild hepatic steatosis, tiny low-density  lesion in the RIGHT hepatic lobe likely a small cyst but too small for definitive characterization at 4 mm. Portal vein is patent. No pericholecystic stranding. No biliary duct distension. Pancreas: Normal, without mass, inflammation or ductal dilatation. Spleen: Spleen normal size and contour. Adrenals/Urinary Tract: Subtle low-attenuation area in the upper pole the LEFT kidney laterally measuring 10 mm. No perivesical stranding. No hydronephrosis. Greater than 70 Hounsfield units on precontrast imaging compatible with a small hemorrhagic cyst Stomach/Bowel: No acute gastrointestinal process.  Normal appendix. Vascular/Lymphatic: Aorta with smooth contours. IVC with smooth contours. No aneurysmal dilation of the abdominal aorta. There is no gastrohepatic or hepatoduodenal ligament lymphadenopathy. No retroperitoneal or mesenteric lymphadenopathy. No pelvic sidewall lymphadenopathy. Reproductive: Post RIGHT orchiectomy. Fluid collection in the RIGHT inguinal canal with mild surrounding stranding, tracks into the RIGHT scrotum  however measuring 6.6 x 3.5 x 12.0 cm. Mild peripheral enhancement seen along the inferior margin as it enters the scrotum. Mild scrotal wall thickening. Other: No ascites. Musculoskeletal: No acute musculoskeletal process. No destructive bone finding. Signs of an incompletely united RIGHT clavicular fracture with callus formation. No suspicious bony lesion. IMPRESSION: Post RIGHT orchiectomy with fluid collection in the RIGHT inguinal canal tracking into the scrotum, more likely postoperative change/seroma. Would however suggest correlation with any symptoms that would suggest infection. Collection is moderately large. No evidence of metastatic disease in the chest, abdomen or pelvis. Tiny hypodense lesion in the RIGHT hepatic lobe more likely a small cyst. Given patient history consider short interval follow-up with MRI in 3-6 months. Subtle ground-glass at the RIGHT lung base nonspecific,  may reflect mild pneumonitis, attention on subsequent imaging. Hemorrhagic cyst of the LEFT kidney. Mild hepatic steatosis. These results will be called to the ordering clinician or representative by the Radiologist Assistant, and communication documented in the PACS or [employer]. Electronically Signed   By: Zetta Bills M.D.   On: 06/28/2021 17:20   MR Abdomen W Wo Contrast  Result Date: 07/08/2021 CLINICAL DATA:  History of testicular malignancy. Evaluate liver lesion. EXAM: MRI ABDOMEN WITHOUT AND WITH CONTRAST TECHNIQUE: Multiplanar multisequence MR imaging of the abdomen was performed both before and after the administration of intravenous contrast. CONTRAST:  9mL GADAVIST GADOBUTROL 1 MMOL/ML IV SOLN COMPARISON:  CT [facility] 06/28/2021 FINDINGS: Lower chest: Patchy area of nonspecific pneumonitis noted within the posterior right lung base corresponding to the non solid density identified on recent CT. Hepatobiliary: Diminished exam detail due to motion artifact. T2 hyperintense structure in the posterior right hepatic lobe measures 6 mm in corresponds to the CT abnormality. No abnormal enhancement. No suspicious enhancing liver lesions. Gallbladder appears normal. No bile duct dilatation. Pancreas: No mass, inflammatory changes, or other parenchymal abnormality identified. Spleen:  Within normal limits in size and appearance. Adrenals/Urinary Tract: Normal adrenal glands. Hemorrhagic cyst within the left kidney measures 1 cm, image 43/19. No mass or hydronephrosis identified bilaterally. Stomach/Bowel: Visualized portions within the abdomen are unremarkable. Vascular/Lymphatic: No pathologically enlarged lymph nodes identified. No abdominal aortic aneurysm demonstrated. Other:  No ascites or focal fluid collections identified. Musculoskeletal: No suspicious bone lesions identified. IMPRESSION: 1. No suspicious liver lesions identified. 2. The small lesion within the liver identified on recent CT  corresponds to a nonenhancing, T2 hyperintense lesion measuring 6 mm. Although technically this is too small to reliably characterize this is favored to represent a small cysts or benign biliary hamartoma. 3. Left kidney hemorrhagic cyst. 4. Persistent, patchy area of nonspecific pneumonitis within the posterior right lung base. Likely related to inflammation. Suggest follow-up imaging in 3 months with CT of the chest to ensure resolution. Electronically Signed   By: Kerby Moors M.D.   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 - Right radical inguinal orchiectomy by Dr. Alyson Ingles on 06/17/2021. - CT CAP on 06/28/2021 with tiny hypodense right hepatic lobe lesion measuring 4 to 5 mm.  Subtle groundglass at the right lung base nonspecific.  Hemorrhagic cyst of the left kidney.  Mild hepatic steatosis.  No other evidence of metastatic disease in the chest, abdomen or pelvis. - Pathology consistent with 7.1 cm seminoma, tumor limited to the testis, margins negative, pT2 Nx. - Pre-orchiectomy tumor markers on 06/09/2021, AFP 4.1 (0-5  0.7), beta-hCG 8 (0-3), LDH 392 (121-224).    Social/family history: - He is seen with his mother today.  He works at [employer].  He vapes but does not smoke cigarettes. - Maternal aunt had metastatic lung cancer.  Maternal uncle died of metastatic throat cancer.  Paternal cousin had cancer.  Maternal grandmother died of melanoma.   PLAN:  Stage IS (T2 NX M0 S2) right testicular seminoma: - I have reviewed results of tumor marker from 07/07/2021 which were negative. - I have also reviewed MRI of the abdomen from 07/08/2021 which did not show any suspicious liver lesions.  Small lesion in the liver identified on recent CT corresponds to nonenhancing T2 hyperintense lesion measuring 6 mm, technically too small to really characterize, favored to represent small cyst  or benign biliary hematoma.  Left kidney hemorrhagic cyst. - We discussed close surveillance with imaging and clinic visits.  In general stage I testicular seminoma has 85% cure rate with surgery itself.  We recommend chemotherapy with carboplatin 2 cycles only if close surveillance is not favored by the patient.  We have discussed pros and cons of both modalities with no difference in overall survival between them. - Patient and his mother would have no problem with close surveillance. - Hence would recommend CT [facility] with tumor markers in 4 months. - After that I would recommend CT AP every 6 months up to 2 to 3 years along with tumor markers.   Orders placed this encounter:  No orders of the defined types were placed in this encounter.    Derek Jack, MD [place] [phone]   I, Thana Ates, am acting as a scribe for Dr. Derek Jack.  I, Derek Jack MD, have reviewed the above documentation for accuracy and completeness, and I agree with the above.

## 2021-07-13 ENCOUNTER — Inpatient Hospital Stay (HOSPITAL_BASED_OUTPATIENT_CLINIC_OR_DEPARTMENT_OTHER): Payer: BC Managed Care – PPO | Admitting: Hematology

## 2021-07-13 ENCOUNTER — Other Ambulatory Visit: Payer: Self-pay

## 2021-07-13 ENCOUNTER — Encounter (HOSPITAL_COMMUNITY): Payer: Self-pay

## 2021-07-13 VITALS — BP 144/107 | HR 78 | Temp 96.7°F | Resp 18 | Ht 71.0 in | Wt 165.6 lb

## 2021-07-13 DIAGNOSIS — K769 Liver disease, unspecified: Secondary | ICD-10-CM

## 2021-07-13 DIAGNOSIS — C6211 Malignant neoplasm of descended right testis: Secondary | ICD-10-CM | POA: Diagnosis not present

## 2021-07-13 NOTE — Patient Instructions (Signed)
[place] at [facility] Discharge Instructions   You were seen and examined today by Dr. Delton Coombes.  He discussed treatment options with you today - we can monitor you with only lab work and scans.  If you would rather not have the frequent monitoring, we can give you 2 rounds of chemotherapy.   We will repeat your scan in 4 months and recheck labs at that time.   Return as scheduled.    Thank you for choosing [place] at [facility] to provide your oncology and hematology care.  To afford each patient quality time with our provider, please arrive at least 15 minutes before your scheduled appointment time.   If you have a lab appointment with the [facility] please come in thru the Main Entrance and check in at the main information desk.  You need to re-schedule your appointment should you arrive 10 or more minutes late.  We strive to give you quality time with our providers, and arriving late affects you and other patients whose appointments are after yours.  Also, if you no show three or more times for appointments you may be dismissed from the clinic at the providers discretion.     Again, thank you for choosing [facility].  Our hope is that these requests will decrease the amount of time that you wait before being seen by our physicians.       _____________________________________________________________  Should you have questions after your visit to [facility], please contact our office at [phone] and follow the prompts.  Our office hours are 8:00 a.m. and 4:30 p.m. Monday - Friday.  Please note that voicemails left after 4:00 p.m. may not be returned until the following business day.  We are closed weekends and major holidays.  You do have access to a nurse 24-7, just call the main number to the clinic [phone] and do not press any options, hold on the line and a nurse will answer the phone.     For prescription refill requests, have your pharmacy contact our office and allow 72 hours.    Due to Covid, you will need to wear a mask upon entering the hospital. If you do not have a mask, a mask will be given to you at the Main Entrance upon arrival. For doctor visits, patients may have 1 support person age 39 or older with them. For treatment visits, patients can not have anyone with them due to social distancing guidelines and our immunocompromised population.

## 2021-07-15 ENCOUNTER — Other Ambulatory Visit (HOSPITAL_COMMUNITY): Payer: Self-pay

## 2021-07-20 ENCOUNTER — Ambulatory Visit (HOSPITAL_COMMUNITY): Payer: Self-pay | Admitting: Hematology

## 2021-08-09 ENCOUNTER — Ambulatory Visit: Payer: Self-pay | Admitting: Urology

## 2021-08-10 ENCOUNTER — Encounter: Payer: Self-pay | Admitting: Family Medicine

## 2021-08-10 ENCOUNTER — Ambulatory Visit: Payer: Self-pay | Admitting: Family Medicine

## 2021-09-10 ENCOUNTER — Encounter: Payer: Self-pay | Admitting: Family Medicine

## 2021-11-12 ENCOUNTER — Inpatient Hospital Stay (HOSPITAL_COMMUNITY): Payer: Self-pay | Attending: Hematology

## 2021-11-12 ENCOUNTER — Ambulatory Visit (HOSPITAL_COMMUNITY)
Admission: RE | Admit: 2021-11-12 | Discharge: 2021-11-12 | Disposition: A | Discharge status: Home / Self Care | Payer: Self-pay | Source: Ambulatory Visit | Attending: Hematology | Admitting: Hematology

## 2021-11-12 ENCOUNTER — Inpatient Hospital Stay (HOSPITAL_COMMUNITY): Payer: BC Managed Care – PPO

## 2021-11-12 ENCOUNTER — Encounter (HOSPITAL_COMMUNITY): Payer: Self-pay

## 2021-11-12 DIAGNOSIS — C6291 Malignant neoplasm of right testis, unspecified whether descended or undescended: Secondary | ICD-10-CM | POA: Insufficient documentation

## 2021-11-12 DIAGNOSIS — C6211 Malignant neoplasm of descended right testis: Secondary | ICD-10-CM | POA: Insufficient documentation

## 2021-11-12 LAB — COMPREHENSIVE METABOLIC PANEL
ALT: 25 U/L (ref 0–44)
AST: 30 U/L (ref 15–41)
Albumin: 4.7 g/dL (ref 3.5–5.0)
Alkaline Phosphatase: 60 U/L (ref 38–126)
Anion gap: 8 (ref 5–15)
BUN: 9 mg/dL (ref 6–20)
CO2: 26 mmol/L (ref 22–32)
Calcium: 9.4 mg/dL (ref 8.9–10.3)
Chloride: 101 mmol/L (ref 98–111)
Creatinine, Ser: 0.82 mg/dL (ref 0.61–1.24)
GFR, Estimated: >60 mL/min (ref >60)
Glucose, Bld: 105 mg/dL — ABNORMAL HIGH (ref 70–99)
Potassium: 3.6 mmol/L (ref 3.5–5.1)
Sodium: 135 mmol/L (ref 135–145)
Total Bilirubin: 1 mg/dL (ref 0.3–1.2)
Total Protein: 8.2 g/dL — ABNORMAL HIGH (ref 6.5–8.1)

## 2021-11-12 LAB — CBC WITH DIFFERENTIAL/PLATELET
Abs Immature Granulocytes: 0.01 10*3/uL (ref 0.00–0.07)
Basophils Absolute: 0.1 10*3/uL (ref 0.0–0.1)
Basophils Relative: 2 %
Eosinophils Absolute: 0.1 10*3/uL (ref 0.0–0.5)
Eosinophils Relative: 2 %
HCT: 41 % (ref 39.0–52.0)
Hemoglobin: 14.9 g/dL (ref 13.0–17.0)
Immature Granulocytes: 0 %
Lymphocytes Relative: 20 %
Lymphs Abs: 0.9 10*3/uL (ref 0.7–4.0)
MCH: 33.3 pg (ref 26.0–34.0)
MCHC: 36.3 g/dL — ABNORMAL HIGH (ref 30.0–36.0)
MCV: 91.7 fL (ref 80.0–100.0)
Monocytes Absolute: 0.4 10*3/uL (ref 0.1–1.0)
Monocytes Relative: 9 %
Neutro Abs: 3.1 10*3/uL (ref 1.7–7.7)
Neutrophils Relative %: 67 %
Platelets: 243 10*3/uL (ref 150–400)
RBC: 4.47 MIL/uL (ref 4.22–5.81)
RDW: 11.5 % (ref 11.5–15.5)
WBC: 4.6 10*3/uL (ref 4.0–10.5)
nRBC: 0 % (ref 0.0–0.2)

## 2021-11-12 LAB — LACTATE DEHYDROGENASE: LDH: 108 U/L (ref 98–192)

## 2021-11-12 MED ORDER — IOHEXOL 300 MG/ML  SOLN
100.0000 mL | Freq: Once | INTRAMUSCULAR | Status: AC | PRN
  Administered 2021-11-12: 85 mL via INTRAVENOUS

## 2021-11-13 LAB — BETA HCG QUANT (REF LAB): hCG Quant: <1 m[IU]/mL (ref 0–3)

## 2021-11-15 LAB — AFP TUMOR MARKER: AFP, Serum, Tumor Marker: 4.3 ng/mL (ref 0.0–5.7)

## 2021-11-18 ENCOUNTER — Inpatient Hospital Stay (HOSPITAL_COMMUNITY): Payer: Self-pay | Attending: Hematology | Admitting: Hematology

## 2021-11-18 VITALS — BP 137/93 | HR 123 | Temp 98.3°F | Resp 18 | Ht 71.06 in | Wt 164.0 lb

## 2021-11-18 DIAGNOSIS — Z8547 Personal history of malignant neoplasm of testis: Secondary | ICD-10-CM | POA: Insufficient documentation

## 2021-11-18 DIAGNOSIS — K769 Liver disease, unspecified: Secondary | ICD-10-CM

## 2021-11-18 DIAGNOSIS — C6211 Malignant neoplasm of descended right testis: Secondary | ICD-10-CM

## 2021-11-18 DIAGNOSIS — Z79899 Other long term (current) drug therapy: Secondary | ICD-10-CM | POA: Insufficient documentation

## 2021-11-18 DIAGNOSIS — F17210 Nicotine dependence, cigarettes, uncomplicated: Secondary | ICD-10-CM | POA: Insufficient documentation

## 2021-11-18 MED ORDER — CITALOPRAM HYDROBROMIDE 40 MG PO TABS: 40.0000 mg | ORAL_TABLET | Freq: Every day | ORAL | 6 refills | Status: DC

## 2021-11-18 NOTE — Progress Notes (Signed)
Terry Huang, [place] [address]   CLINIC:  Medical Oncology/Hematology  PCP:  Coral Spikes, DO [address] [place] [address] [phone]   REASON FOR VISIT:  Follow-up for testicular seminoma  PRIOR THERAPY: none  NGS Results: not done  CURRENT THERAPY: surveillance  BRIEF ONCOLOGIC HISTORY:  Oncology History   No history exists.    CANCER STAGING:  Cancer Staging  Seminoma of descended right testis [facility]) Staging form: Testis, AJCC 7th Edition - Clinical stage from 07/06/2021: Stage Unknown (pT2, NX, M0, S2) - Unsigned   INTERVAL HISTORY:  Terry Huang, a 31 y.o. male, returns for routine follow-up of his testicular seminoma. Eliaz was last seen on 07/13/2021.   Today he reports feeling good. He denies new pains. He reports stable diarrhea occurring 4 times weekly. He reports dizziness occurring 2 times weekly. He stopped Celexa 1 week ago. He reports increased depression.   REVIEW OF SYSTEMS:  Review of Systems  Constitutional:  Negative for appetite change and fatigue.  Gastrointestinal:  Positive for diarrhea (stable).  Neurological:  Positive for dizziness.  Psychiatric/Behavioral:  Positive for depression and sleep disturbance. The patient is nervous/anxious.   All other systems reviewed and are negative.  PAST MEDICAL/SURGICAL HISTORY:  Past Medical History:  Diagnosis Date   ADD (attention deficit disorder)    Cancer [facility])    Past Surgical History:  Procedure Laterality Date   HERNIA REPAIR     ORCHIECTOMY Right 06/17/2021   Procedure: RIGHT RADICAL ORCHIECTOMY;  Surgeon: Cleon Gustin, MD;  Location: [facility];  Service: Urology;  Laterality: Right;    SOCIAL HISTORY:  Social History   Socioeconomic History   Marital status: Single    Spouse name: Not on file   Number of children: Not on file   Years of education: Not on file   Highest education level: Not on file  Occupational  History   Not on file  Tobacco Use   Smoking status: Former    Years: 1.00    Types: Cigarettes    Quit date: 06/08/2010    Years since quitting: 11.4   Smokeless tobacco: Never   Tobacco comments:    pt smokes e-cigs  Vaping Use   Vaping Use: Every day   Substances: Nicotine, Flavoring  Substance and Sexual Activity   Alcohol use: Yes    Comment: socially    Drug use: No   Sexual activity: Yes  Other Topics Concern   Not on file  Social History Narrative   Not on file   Social Determinants of Health   Financial Resource Strain: Not on file  Food Insecurity: Not on file  Transportation Needs: Not on file  Physical Activity: Not on file  Stress: Not on file  Social Connections: Not on file  Intimate Partner Violence: Not on file    FAMILY HISTORY:  No family history on file.  CURRENT MEDICATIONS:  Current Outpatient Medications  Medication Sig Dispense Refill   HYDROcodone-acetaminophen (NORCO) 5-325 MG tablet Take 1 tablet by mouth every 4 (four) hours as needed for moderate pain. 30 tablet 0   ibuprofen (ADVIL) 200 MG tablet Take 400 mg by mouth every 6 (six) hours as needed for moderate pain.     losartan (COZAAR) 25 MG tablet Take 1 tablet (25 mg total) by mouth daily. 45 tablet 0   omeprazole (PRILOSEC) 20 MG capsule Take 20 mg by mouth daily as  needed (heartburn).     ondansetron (ZOFRAN) 4 MG tablet Take 1 tablet (4 mg total) by mouth daily as needed for nausea or vomiting. 30 tablet 1   citalopram (CELEXA) 40 MG tablet Take 1 tablet (40 mg total) by mouth daily. 30 tablet 6   No current facility-administered medications for this visit.    ALLERGIES:  No Known Allergies  PHYSICAL EXAM:  Performance status (ECOG): 0 - Asymptomatic  Vitals:   11/18/21 1532  BP: (!) 137/93  Pulse: (!) 123  Resp: 18  Temp: 98.3 F (36.8 C)  SpO2: 93%   Wt Readings from Last 3 Encounters:  11/18/21 164 lb (74.4 kg)  07/13/21 165 lb 9.6 oz (75.1 kg)  07/06/21 165  lb (74.8 kg)   Physical Exam Vitals reviewed.  Constitutional:      Appearance: Normal appearance.  Cardiovascular:     Rate and Rhythm: Normal rate and regular rhythm.     Pulses: Normal pulses.     Heart sounds: Normal heart sounds.  Pulmonary:     Effort: Pulmonary effort is normal.     Breath sounds: Normal breath sounds.  Abdominal:     Palpations: Abdomen is soft. There is no hepatomegaly, splenomegaly or mass.     Tenderness: There is no abdominal tenderness.  Lymphadenopathy:     Lower Body: No right inguinal adenopathy. No left inguinal adenopathy.  Neurological:     General: No focal deficit present.     Mental Status: He is alert and oriented to person, place, and time.  Psychiatric:        Mood and Affect: Mood normal.        Behavior: Behavior normal.     LABORATORY DATA:  I have reviewed the labs as listed.     Latest Ref Rng & Units 11/12/2021    2:54 PM 06/17/2021   10:08 AM 10/06/2019    2:28 AM  CBC  WBC 4.0 - 10.5 K/uL 4.6   6.1   7.3    Hemoglobin 13.0 - 17.0 g/dL 14.9   15.0   16.4    Hematocrit 39.0 - 52.0 % 41.0   42.7   46.2    Platelets 150 - 400 K/uL 243   255   383        Latest Ref Rng & Units 11/12/2021    2:54 PM 10/06/2019    2:28 AM  CMP  Glucose 70 - 99 mg/dL 105   102    BUN 6 - 20 mg/dL 9   6    Creatinine 0.61 - 1.24 mg/dL 0.82   0.71    Sodium 135 - 145 mmol/L 135   135    Potassium 3.5 - 5.1 mmol/L 3.6   3.3    Chloride 98 - 111 mmol/L 101   100    CO2 22 - 32 mmol/L 26   22    Calcium 8.9 - 10.3 mg/dL 9.4   8.6    Total Protein 6.5 - 8.1 g/dL 8.2   8.3    Total Bilirubin 0.3 - 1.2 mg/dL 1.0   0.9    Alkaline Phos 38 - 126 U/L 60   82    AST 15 - 41 U/L 30   62    ALT 0 - 44 U/L 25   54      DIAGNOSTIC IMAGING:  I have independently reviewed the scans and discussed with the patient. CT Abdomen Pelvis W Contrast  Result Date:  11/12/2021 CLINICAL DATA:  History of right testicular cancer status post orchiectomy.  Monitor/follow-up * Tracking Code: BO * EXAM: CT ABDOMEN AND PELVIS WITH CONTRAST TECHNIQUE: Multidetector CT imaging of the abdomen and pelvis was performed using the standard protocol following bolus administration of intravenous contrast. RADIATION DOSE REDUCTION: This exam was performed according to the departmental dose-optimization program which includes automated exposure control, adjustment of the mA and/or kV according to patient size and/or use of iterative reconstruction technique. CONTRAST:  20m OMNIPAQUE IOHEXOL 300 MG/ML  SOLN COMPARISON:  Multiple priors including CT June 28, 2021 and MRI July 07, 2021. FINDINGS: Lower chest: Focal ground-glass area in the right lung base is similar prior likely sequela of infection/inflammation. Hepatobiliary: Stable hypodense 5 mm lesion in the posterior right hepatic lobe previously evaluated by MRI, which although technically too small to accurately characterize demonstrated benign imaging characteristics most likely reflecting a cyst or hemangioma. No new suspicious hepatic lesions. Pharyngeal cap of the gallbladder. No biliary ductal dilation. Pancreas: No pancreatic ductal dilation or evidence of acute inflammation. Spleen: No splenomegaly or focal splenic lesion. Adrenals/Urinary Tract: Bilateral adrenal glands appear normal. 1 cm left upper pole cystic lesion previously characterized as a benign hemorrhagic cyst on CT June 28, 2021 requiring no independent follow-up. No hydronephrosis. Kidneys demonstrate symmetric enhancement and excretion of contrast material. Mild symmetric wall thickening of an incompletely distended urinary bladder. Stomach/Bowel: Radiopaque enteric contrast material traverses the rectum. Stomach is unremarkable for degree of distension. No pathologic dilation of small or large bowel. The appendix and terminal ileum appear normal. No evidence of acute bowel inflammation. Vascular/Lymphatic: Normal caliber abdominal aorta. No  pathologically enlarged abdominal or pelvic lymph nodes. Reproductive: Status post right orchiectomy with near complete resolution of the postoperative fluid collection in the right inguinal canal now with minimal residual fluid for instance on image 86/2. Other: No significant abdominopelvic free fluid. No discrete peritoneal or omental nodularity. Musculoskeletal: No aggressive lytic or blastic lesion of bone. IMPRESSION: 1. Status post right orchiectomy with near complete resolution of the postoperative fluid collection in the right inguinal canal. 2. No convincing evidence of metastatic disease within the abdomen or pelvis. 3. Focal ground-glass area in the right lung base is similar to prior likely sequela of infection/inflammation. Attention on follow-up imaging to resolution is suggested. 4. Stable 5 mm hypodense lesion in the posterior right hepatic lobe previously evaluated on MRI and while technically too small to accurately characterize demonstrating benign imaging fissures and stability is most consistent with a benign etiology such as a cyst or hemangioma. Continued attention on follow-up imaging suggested. Electronically Signed   By: JDahlia BailiffM.D.   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 - Right radical inguinal orchiectomy by Dr. MAlyson Ingleson 06/17/2021. - CT CAP on 06/28/2021 with tiny hypodense right hepatic lobe lesion measuring 4 to 5 mm.  Subtle groundglass at the right lung base nonspecific.  Hemorrhagic cyst of the left kidney.  Mild hepatic steatosis.  No other evidence of metastatic disease in the chest, abdomen or pelvis. - Pathology consistent with 7.1 cm seminoma, tumor limited to the testis, margins negative, pT2 Nx. - Pre-orchiectomy tumor markers on 06/09/2021, AFP 4.1 (0-5 0.7), beta-hCG 8 (0-3), LDH 392 (121-224). - I have also reviewed MRI of the abdomen from  07/08/2021 which did not show any suspicious liver lesions.  Small lesion in  the liver identified on recent CT corresponds to nonenhancing T2 hyperintense lesion measuring 6 mm, technically too small to really characterize, favored to represent small cyst or benign biliary hematoma.  Left kidney hemorrhagic cyst. - We discussed close surveillance with imaging and clinic visits.  In general stage I testicular seminoma has 85% cure rate with surgery itself.  We recommend chemotherapy with carboplatin 2 cycles only if close surveillance is not favored by the patient.  We have discussed pros and cons of both modalities with no difference in overall survival between them. - Patient and his mother would have no problem with close surveillance.   Social/family history: - He is seen with his mother today.  He works at [employer].  He vapes but does not smoke cigarettes. - Maternal aunt had metastatic lung cancer.  Maternal uncle died of metastatic throat cancer.  Paternal cousin had cancer.  Maternal grandmother died of melanoma.   PLAN:  Stage IS (T2 NX M0 S2) right testicular seminoma: - He does not have any signs or symptoms of recurrence. - Reviewed CTAP with contrast from 11/12/2021: No convincing evidence of metastatic disease in the abdomen or pelvis.  Focal groundglass area in the right lung bases similar to prior.  Stable 5 mm hypodense lesion in the posterior right hepatic lobe previously evaluated on the MRI technically too small to accurately characterize, hemangioma is a possibility. - Reviewed labs today which showed tumor markers are negative.  CBC and CMP was grossly normal. -Surveillance plan is CTAP every 6 months up to 2 to 3 years along with tumor markers. - RTC 6 months for follow-up with repeat scan and tumor markers.   Orders placed this encounter:  Orders Placed This Encounter  Procedures   CT Abdomen Pelvis W Contrast   CBC with Differential/Platelet   Comprehensive metabolic  panel   HCG, tumor marker   AFP tumor marker     Derek Jack, MD [place] [phone]   I, Thana Ates, am acting as a scribe for Dr. Derek Jack.  I, Derek Jack MD, have reviewed the above documentation for accuracy and completeness, and I agree with the above.

## 2021-11-18 NOTE — Patient Instructions (Addendum)
[place] at [facility] ?Discharge Instructions ? ?You were seen and examined today by Dr. Delton Coombes. ? ?Dr. Delton Coombes discussed your most recent lab work and CT scan and everything looks good. ? ?Dr. Delton Coombes is ordering repeat CT abdomen and pelvis along with tumor markers to be done before your next appointment. ? ?Follow-up as scheduled in 6 months. ? ? ? ?Thank you for choosing [place] at [facility] to provide your oncology and hematology care.  To afford each patient quality time with our provider, please arrive at least 15 minutes before your scheduled appointment time.  ? ?If you have a lab appointment with the [place] please come in thru the Main Entrance and check in at the main information desk. ? ?You need to re-schedule your appointment should you arrive 10 or more minutes late.  We strive to give you quality time with our providers, and arriving late affects you and other patients whose appointments are after yours.  Also, if you no show three or more times for appointments you may be dismissed from the clinic at the providers discretion.     ?Again, thank you for choosing [facility].  Our hope is that these requests will decrease the amount of time that you wait before being seen by our physicians.       ?_____________________________________________________________ ? ?Should you have questions after your visit to [facility], please contact our office at [phone] and follow the prompts.  Our office hours are 8:00 a.m. and 4:30 p.m. Monday - Friday.  Please note that voicemails left after 4:00 p.m. may not be returned until the following business day.  We are closed weekends and major holidays.  You do have access to a nurse 24-7, just call the main number to the clinic [phone] and do not press any options, hold on the line and a nurse will answer the phone.   ? ?For prescription refill requests, have  your pharmacy contact our office and allow 72 hours.   ? ?Due to Covid, you will need to wear a mask upon entering the hospital. If you do not have a mask, a mask will be given to you at the Main Entrance upon arrival. For doctor visits, patients may have 1 support person age 27 or older with them. For treatment visits, patients can not have anyone with them due to social distancing guidelines and our immunocompromised population.  ? ?  ?

## 2022-05-20 ENCOUNTER — Ambulatory Visit (HOSPITAL_COMMUNITY)
Admission: RE | Admit: 2022-05-20 | Discharge: 2022-05-20 | Disposition: A | Discharge status: Home / Self Care | Payer: Self-pay | Source: Ambulatory Visit | Attending: Hematology | Admitting: Hematology

## 2022-05-20 ENCOUNTER — Encounter (HOSPITAL_COMMUNITY): Payer: Self-pay

## 2022-05-20 ENCOUNTER — Inpatient Hospital Stay: Payer: Self-pay | Attending: Hematology

## 2022-05-20 DIAGNOSIS — C6211 Malignant neoplasm of descended right testis: Secondary | ICD-10-CM | POA: Insufficient documentation

## 2022-05-20 DIAGNOSIS — K769 Liver disease, unspecified: Secondary | ICD-10-CM | POA: Insufficient documentation

## 2022-05-20 LAB — COMPREHENSIVE METABOLIC PANEL
ALT: 21 U/L (ref 0–44)
AST: 23 U/L (ref 15–41)
Albumin: 4.2 g/dL (ref 3.5–5.0)
Alkaline Phosphatase: 46 U/L (ref 38–126)
Anion gap: 8 (ref 5–15)
BUN: 9 mg/dL (ref 6–20)
CO2: 27 mmol/L (ref 22–32)
Calcium: 8.9 mg/dL (ref 8.9–10.3)
Chloride: 103 mmol/L (ref 98–111)
Creatinine, Ser: 0.84 mg/dL (ref 0.61–1.24)
GFR, Estimated: >60 mL/min (ref >60)
Glucose, Bld: 99 mg/dL (ref 70–99)
Potassium: 3.4 mmol/L — ABNORMAL LOW (ref 3.5–5.1)
Sodium: 138 mmol/L (ref 135–145)
Total Bilirubin: 0.9 mg/dL (ref 0.3–1.2)
Total Protein: 7.2 g/dL (ref 6.5–8.1)

## 2022-05-20 LAB — CBC WITH DIFFERENTIAL/PLATELET
Abs Immature Granulocytes: 0.01 10*3/uL (ref 0.00–0.07)
Basophils Absolute: 0.1 10*3/uL (ref 0.0–0.1)
Basophils Relative: 1 %
Eosinophils Absolute: 0.1 10*3/uL (ref 0.0–0.5)
Eosinophils Relative: 1 %
HCT: 38 % — ABNORMAL LOW (ref 39.0–52.0)
Hemoglobin: 13.6 g/dL (ref 13.0–17.0)
Immature Granulocytes: 0 %
Lymphocytes Relative: 20 %
Lymphs Abs: 0.9 10*3/uL (ref 0.7–4.0)
MCH: 32.9 pg (ref 26.0–34.0)
MCHC: 35.8 g/dL (ref 30.0–36.0)
MCV: 92 fL (ref 80.0–100.0)
Monocytes Absolute: 0.4 10*3/uL (ref 0.1–1.0)
Monocytes Relative: 9 %
Neutro Abs: 3.2 10*3/uL (ref 1.7–7.7)
Neutrophils Relative %: 69 %
Platelets: 235 10*3/uL (ref 150–400)
RBC: 4.13 MIL/uL — ABNORMAL LOW (ref 4.22–5.81)
RDW: 11.6 % (ref 11.5–15.5)
WBC: 4.6 10*3/uL (ref 4.0–10.5)
nRBC: 0 % (ref 0.0–0.2)

## 2022-05-20 MED ORDER — IOHEXOL 300 MG/ML  SOLN
100.0000 mL | Freq: Once | INTRAMUSCULAR | Status: AC | PRN
  Administered 2022-05-20: 85 mL via INTRAVENOUS

## 2022-05-21 LAB — AFP TUMOR MARKER: AFP, Serum, Tumor Marker: 3.5 ng/mL (ref 0.0–5.7)

## 2022-05-22 LAB — BETA HCG QUANT (REF LAB): hCG Quant: <1 m[IU]/mL (ref 0–3)

## 2022-05-26 ENCOUNTER — Inpatient Hospital Stay: Payer: Self-pay | Admitting: Hematology

## 2022-09-14 NOTE — Progress Notes (Signed)
Patient no showed follow up and multiple attempts were made to reschedule.

## 2022-11-11 ENCOUNTER — Ambulatory Visit (INDEPENDENT_AMBULATORY_CARE_PROVIDER_SITE_OTHER): Payer: Self-pay | Admitting: Urology

## 2022-11-11 ENCOUNTER — Encounter: Payer: Self-pay | Admitting: Urology

## 2022-11-11 VITALS — BP 141/103 | HR 84

## 2022-11-11 DIAGNOSIS — N5089 Other specified disorders of the male genital organs: Secondary | ICD-10-CM

## 2022-11-11 DIAGNOSIS — N50812 Left testicular pain: Secondary | ICD-10-CM

## 2022-11-11 LAB — URINALYSIS, ROUTINE W REFLEX MICROSCOPIC
Bilirubin, UA: NEGATIVE
Glucose, UA: NEGATIVE
Leukocytes,UA: NEGATIVE
Nitrite, UA: NEGATIVE
Specific Gravity, UA: 1.02 (ref 1.005–1.030)
Urobilinogen, Ur: 0.2 mg/dL (ref 0.2–1.0)
pH, UA: 5.5 (ref 5.0–7.5)

## 2022-11-11 LAB — MICROSCOPIC EXAMINATION
Bacteria, UA: NONE SEEN
WBC, UA: NONE SEEN /hpf (ref 0–5)

## 2022-11-11 MED ORDER — MELOXICAM 7.5 MG PO TABS: 7.5000 mg | ORAL_TABLET | Freq: Every day | ORAL | 2 refills | Status: DC

## 2022-11-11 NOTE — Progress Notes (Unsigned)
11/11/2022 12:28 PM   Terry Huang 1990-10-05 [id number]  Referring provider: Tommie Sams, DO [address] [place],  [place] [address]  Testicular pain   HPI: Terry Huang is a 31yo here for evaluation of left testicular pain. For the past several months he has had bilateral inguinal and left testicular pain. The pain is worse with activity and better with rest. No significant LUTS   PMH: Past Medical History:  Diagnosis Date   ADD (attention deficit disorder)    Cancer [facility])     Surgical History: Past Surgical History:  Procedure Laterality Date   HERNIA REPAIR     ORCHIECTOMY Right 06/17/2021   Procedure: RIGHT RADICAL ORCHIECTOMY;  Surgeon: Terry Gauze, MD;  Location: [facility];  Service: Urology;  Laterality: Right;    Home Medications:  Allergies as of 11/11/2022   No Known Allergies      Medication List        Accurate as of November 11, 2022 12:28 PM. If you have any questions, ask your nurse or doctor.          STOP taking these medications    citalopram 40 MG tablet Commonly known as: CELEXA   HYDROcodone-acetaminophen 5-325 MG tablet Commonly known as: Norco   losartan 25 MG tablet Commonly known as: COZAAR       TAKE these medications    ibuprofen 200 MG tablet Commonly known as: ADVIL Take 400 mg by mouth every 6 (six) hours as needed for moderate pain.   omeprazole 20 MG capsule Commonly known as: PRILOSEC Take 20 mg by mouth daily as needed (heartburn).        Allergies: No Known Allergies  Family History: No family history on file.  Social History:  reports that he quit smoking about 12 years ago. His smoking use included cigarettes. He has never used smokeless tobacco. He reports current alcohol use. He reports that he does not use drugs.  ROS: All other review of systems were reviewed and are negative except what is noted above in HPI  Physical Exam: BP (!) 141/103   Pulse 84   Constitutional:  Alert  and oriented, No acute distress. HEENT: Terry Huang AT, moist mucus membranes.  Trachea midline, no masses. Cardiovascular: No clubbing, cyanosis, or edema. Respiratory: Normal respiratory effort, no increased work of breathing. GI: Abdomen is soft, nontender, nondistended, no abdominal masses GU: No CVA tenderness. Normal left testis and epididymis. Absent right testis Lymph: No cervical or inguinal lymphadenopathy. Skin: No rashes, bruises or suspicious lesions. Neurologic: Grossly intact, no focal deficits, moving all 4 extremities. Psychiatric: Normal mood and affect.  Laboratory Data: Lab Results  Component Value Date   WBC 4.6 05/20/2022   HGB 13.6 05/20/2022   HCT 38.0 (L) 05/20/2022   MCV 92.0 05/20/2022   PLT 235 05/20/2022    Lab Results  Component Value Date   CREATININE 0.84 05/20/2022    No results found for: "PSA"  No results found for: "TESTOSTERONE"  No results found for: "HGBA1C"  Urinalysis    Component Value Date/Time   APPEARANCEUR Clear 06/09/2021 1705   GLUCOSEU Negative 06/09/2021 1705   BILIRUBINUR Negative 06/09/2021 1705   PROTEINUR Negative 06/09/2021 1705   NITRITE Negative 06/09/2021 1705   LEUKOCYTESUR Negative 06/09/2021 1705    Lab Results  Component Value Date   LABMICR Comment 06/09/2021    Pertinent Imaging:  Results for orders placed in visit on 01/02/01  DG Abd 1 View  Narrative FINDINGS HISTORY: ABDOMINAL PAIN WITH OCCASIONAL NAUSEA. ABDOMEN 1 VIEW: NORMAL BOWEL GAS PATTERN WITH SMALL AMOUNT OF STOOL THROUGHOUT THE COLON.  NO DILATED BOWEL LOOPS OR SUPINE FREE AIR.  NO SUSPICIOUS CALCIFICATIONS. IMPRESSION NO ACUTE ABNORMALITIES WITHIN THE ABDOMEN.  No results found for this or any previous visit.  No results found for this or any previous visit.  No results found for this or any previous visit.  No results found for this or any previous visit.  No valid procedures specified. No results found for this or any  previous visit.  No results found for this or any previous visit.   Assessment & Plan:    1. Pain in left testicle Meloxicam 7.5mg  daily   No follow-ups on file.  Wilkie Aye, MD  [facility] [place]

## 2022-11-15 ENCOUNTER — Encounter: Payer: Self-pay | Admitting: Urology

## 2023-01-03 ENCOUNTER — Ambulatory Visit (INDEPENDENT_AMBULATORY_CARE_PROVIDER_SITE_OTHER): Payer: Self-pay | Admitting: Urology

## 2023-01-03 VITALS — BP 133/93 | HR 105

## 2023-01-03 DIAGNOSIS — Z87438 Personal history of other diseases of male genital organs: Secondary | ICD-10-CM

## 2023-01-03 DIAGNOSIS — Z09 Encounter for follow-up examination after completed treatment for conditions other than malignant neoplasm: Secondary | ICD-10-CM

## 2023-01-03 DIAGNOSIS — N50812 Left testicular pain: Secondary | ICD-10-CM

## 2023-01-03 NOTE — Progress Notes (Signed)
01/03/2023 3:12 PM   Terry Huang 1990-08-16 [id number]  Referring provider: Tommie Sams, DO [address] [place],  [place] [address]     HPI: Terry Huang is a 31yo here for followup for testis pain. The pain resolved after taking meloxicam. He has issues with dehydration 3 weeks ago and was taken to the ER. The issue has since resolved. No other complaints today   PMH: Past Medical History:  Diagnosis Date   ADD (attention deficit disorder)    Cancer [facility])     Surgical History: Past Surgical History:  Procedure Laterality Date   HERNIA REPAIR     ORCHIECTOMY Right 06/17/2021   Procedure: RIGHT RADICAL ORCHIECTOMY;  Surgeon: Malen Gauze, MD;  Location: [facility];  Service: Urology;  Laterality: Right;    Home Medications:  Allergies as of 01/03/2023   No Known Allergies      Medication List        Accurate as of January 03, 2023  3:12 PM. If you have any questions, ask your nurse or doctor.          ibuprofen 200 MG tablet Commonly known as: ADVIL Take 400 mg by mouth every 6 (six) hours as needed for moderate pain.   meloxicam 7.5 MG tablet Commonly known as: Mobic Take 1 tablet (7.5 mg total) by mouth daily.   omeprazole 20 MG capsule Commonly known as: PRILOSEC Take 20 mg by mouth daily as needed (heartburn).        Allergies: No Known Allergies  Family History: No family history on file.  Social History:  reports that he quit smoking about 12 years ago. His smoking use included cigarettes. He has never used smokeless tobacco. He reports current alcohol use. He reports that he does not use drugs.  ROS: All other review of systems were reviewed and are negative except what is noted above in HPI  Physical Exam: BP (!) 133/93   Pulse (!) 105   Constitutional:  Alert and oriented, No acute distress. HEENT: [place] AT, moist mucus membranes.  Trachea midline, no masses. Cardiovascular: No clubbing, cyanosis, or edema. Respiratory:  Normal respiratory effort, no increased work of breathing. GI: Abdomen is soft, nontender, nondistended, no abdominal masses GU: No CVA tenderness.  Lymph: No cervical or inguinal lymphadenopathy. Skin: No rashes, bruises or suspicious lesions. Neurologic: Grossly intact, no focal deficits, moving all 4 extremities. Psychiatric: Normal mood and affect.  Laboratory Data: Lab Results  Component Value Date   WBC 4.6 05/20/2022   HGB 13.6 05/20/2022   HCT 38.0 (L) 05/20/2022   MCV 92.0 05/20/2022   PLT 235 05/20/2022    Lab Results  Component Value Date   CREATININE 0.84 05/20/2022    No results found for: "PSA"  No results found for: "TESTOSTERONE"  No results found for: "HGBA1C"  Urinalysis    Component Value Date/Time   APPEARANCEUR Clear 11/11/2022 1202   GLUCOSEU Negative 11/11/2022 1202   BILIRUBINUR Negative 11/11/2022 1202   PROTEINUR 2+ (A) 11/11/2022 1202   NITRITE Negative 11/11/2022 1202   LEUKOCYTESUR Negative 11/11/2022 1202    Lab Results  Component Value Date   LABMICR See below: 11/11/2022   WBCUA None seen 11/11/2022   LABEPIT 0-10 11/11/2022   BACTERIA None seen 11/11/2022    Pertinent Imaging:  Results for orders placed in visit on 01/02/01  DG Abd 1 View  Narrative FINDINGS HISTORY: ABDOMINAL PAIN WITH OCCASIONAL NAUSEA. ABDOMEN 1 VIEW: NORMAL BOWEL GAS  PATTERN WITH SMALL AMOUNT OF STOOL THROUGHOUT THE COLON.  NO DILATED BOWEL LOOPS OR SUPINE FREE AIR.  NO SUSPICIOUS CALCIFICATIONS. IMPRESSION NO ACUTE ABNORMALITIES WITHIN THE ABDOMEN.  No results found for this or any previous visit.  No results found for this or any previous visit.  No results found for this or any previous visit.  No results found for this or any previous visit.  No valid procedures specified. No results found for this or any previous visit.  No results found for this or any previous visit.   Assessment & Plan:    1. Pain in left testicle -resolved.  Followup prn   No follow-ups on file.  Wilkie Aye, MD  [facility] [place]

## 2023-01-10 ENCOUNTER — Encounter: Payer: Self-pay | Admitting: Urology

## 2024-04-24 IMAGING — CT CT ABD-PELV W/ CM
2 of 6 series · 14 of 46 positions shown, 16 images · IV contrast (Omnipaque or Isovue)
Comparison: Multiple priors including CT June 28, 2021 and MRI
July 07, 2021.

CLINICAL DATA: History of right testicular cancer status post
orchiectomy. Monitor/follow-up

* Tracking Code: BO *
EXAM:
CT ABDOMEN AND PELVIS WITH CONTRAST
TECHNIQUE: Multidetector CT imaging of the abdomen and pelvis was performed
using the standard protocol following bolus administration of
intravenous contrast.

[Series 3: thins · axial · 0.79mm/px · z∈[+681,+1144]mm · 11 of 796 slices shown, 13 images]
[im 67/796  soft-tissue]
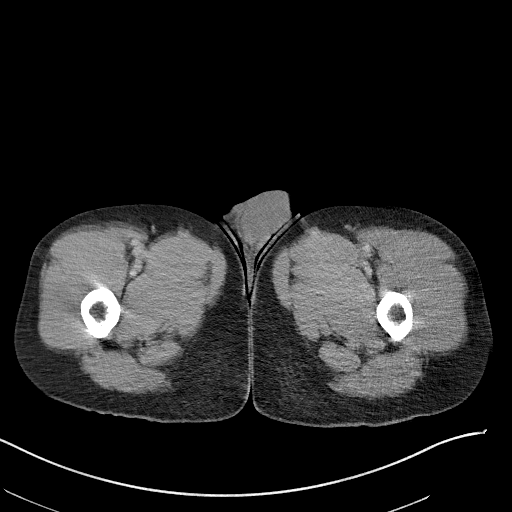
[im 67/796  bone]
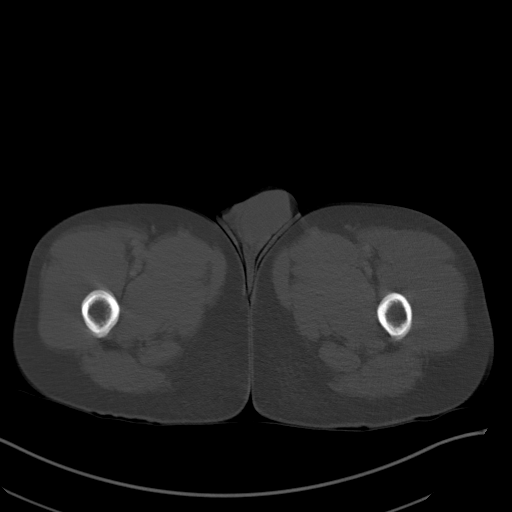
[im 133/796  soft-tissue]
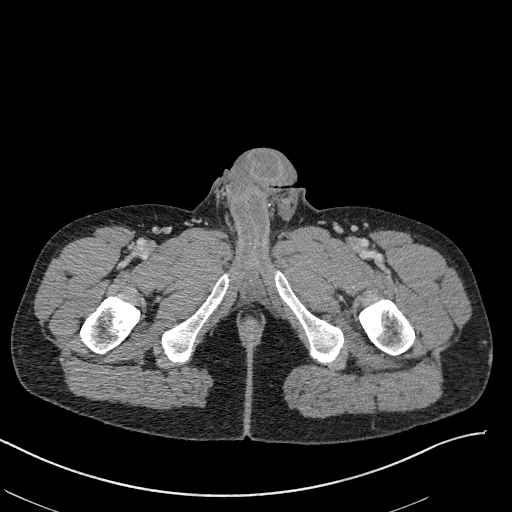
[im 199/796  soft-tissue]
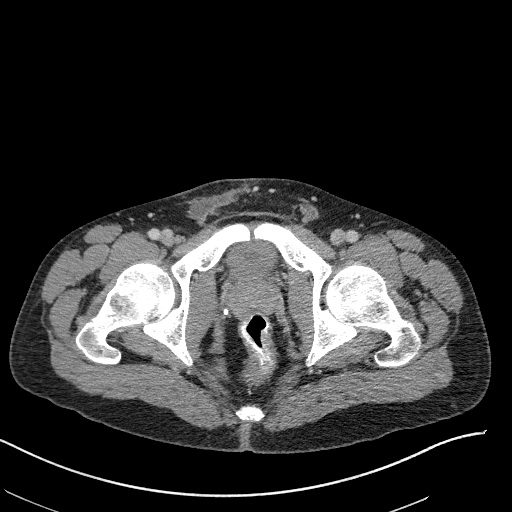
[im 266/796  soft-tissue]
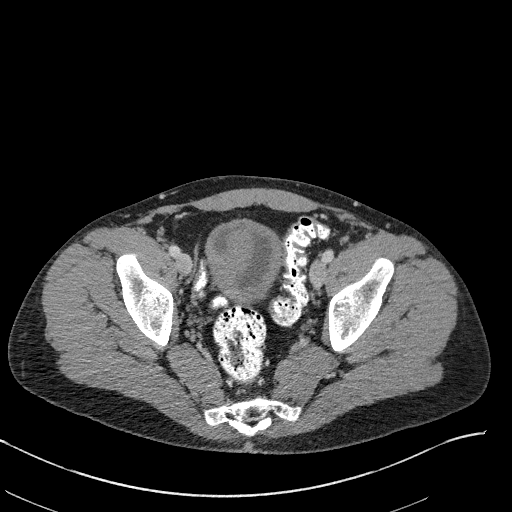
[im 332/796  soft-tissue]
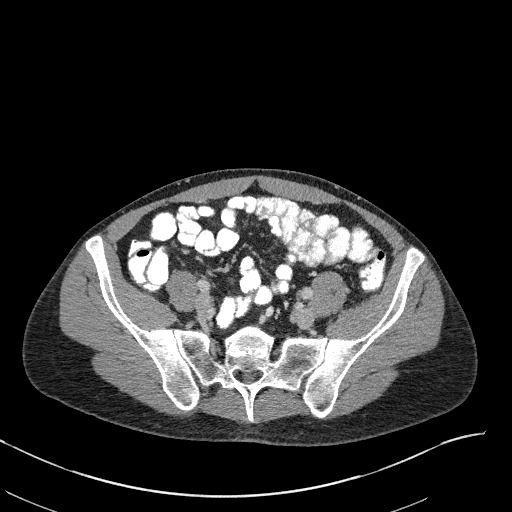
[im 398/796  soft-tissue]
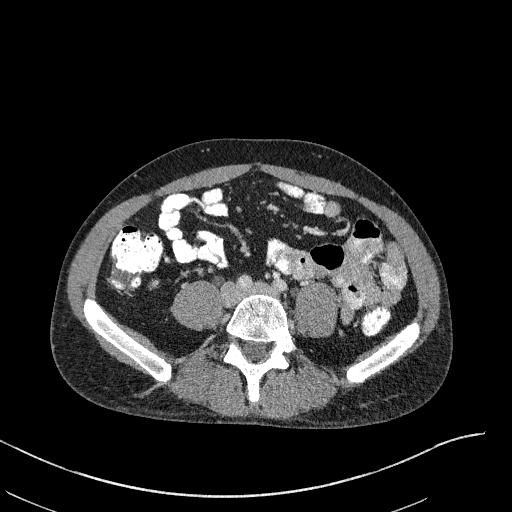
[im 464/796  soft-tissue]
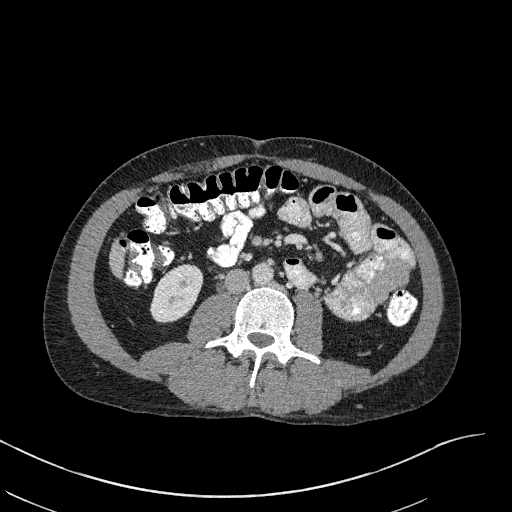
[im 531/796  soft-tissue]
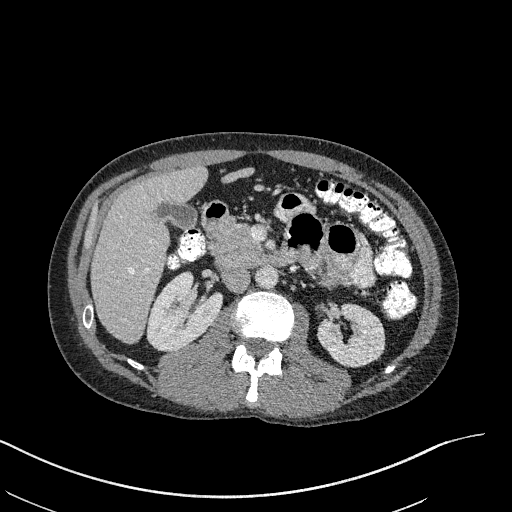
[im 597/796  soft-tissue]
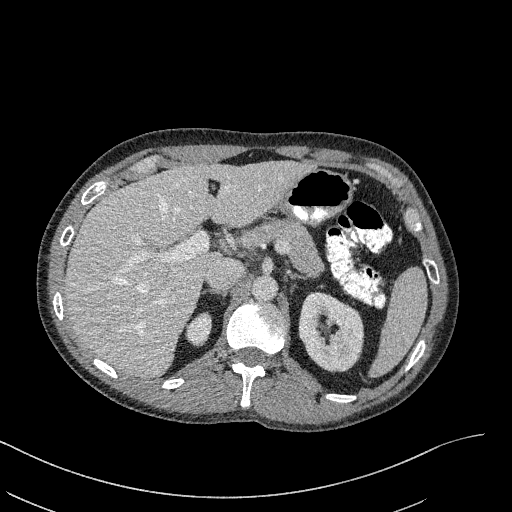
[im 597/796  bone]
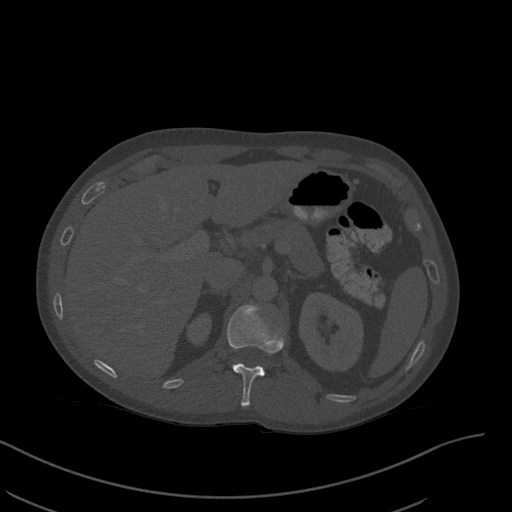
[im 663/796  soft-tissue]
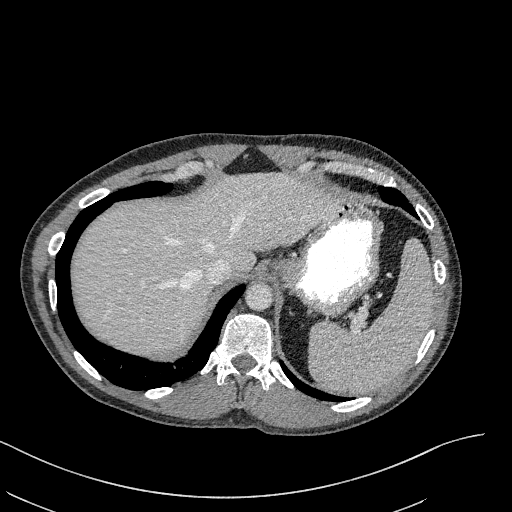
[im 729/796  soft-tissue]
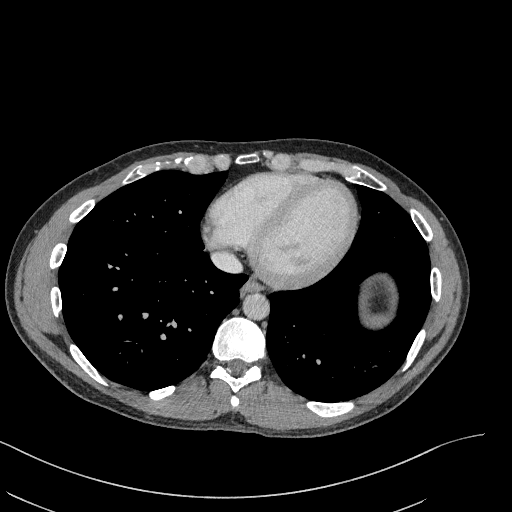

[Series 5: coronal st · coronal · 0.79mm/px · 3 of 101 slices shown]
[im 34/101  soft-tissue]
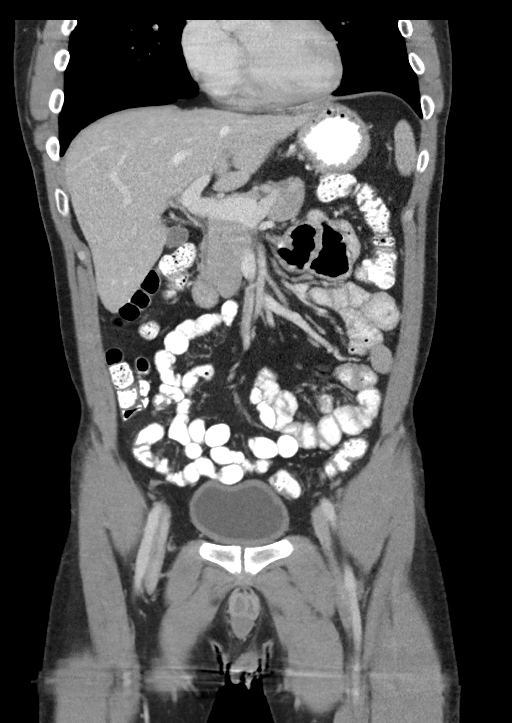
[im 45/101  soft-tissue]
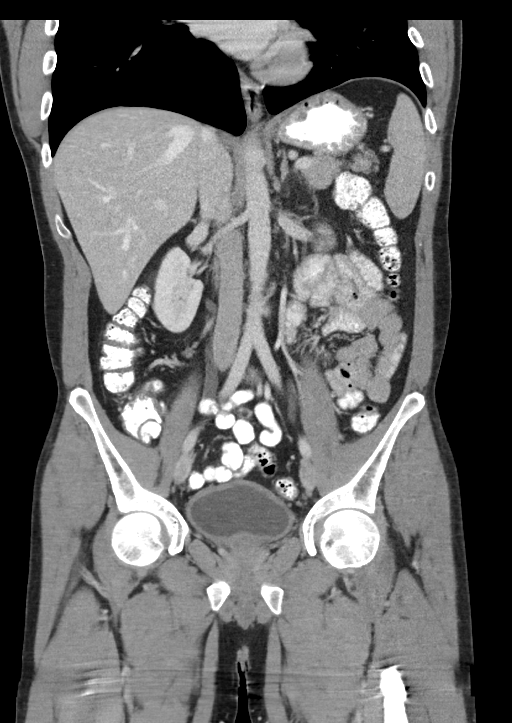
[im 56/101  soft-tissue]
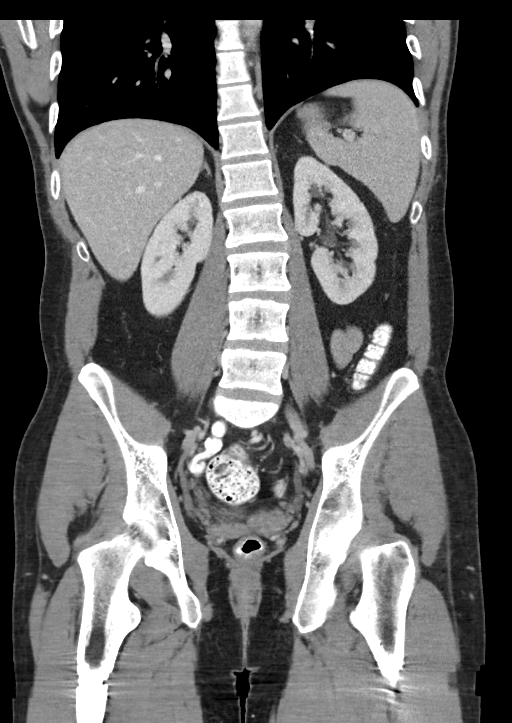

[14 of 46 positions shown; findings below may reference images not displayed]

RADIATION DOSE REDUCTION: This exam was performed according to the
departmental dose-optimization program which includes automated
exposure control, adjustment of the mA and/or kV according to
patient size and/or use of iterative reconstruction technique.

CONTRAST:  85mL OMNIPAQUE IOHEXOL 300 MG/ML  SOLN
FINDINGS: Lower chest: Focal ground-glass area in the right lung base is
similar prior likely sequela of infection/inflammation.

Hepatobiliary: Stable hypodense 5 mm lesion in the posterior right
hepatic lobe previously evaluated by MRI, which although technically
too small to accurately characterize demonstrated benign imaging
characteristics most likely reflecting a cyst or hemangioma. No new
suspicious hepatic lesions. Pharyngeal cap of the gallbladder. No
biliary ductal dilation.

Pancreas: No pancreatic ductal dilation or evidence of acute
inflammation.

Spleen: No splenomegaly or focal splenic lesion.

Adrenals/Urinary Tract: Bilateral adrenal glands appear normal. 1 cm
left upper pole cystic lesion previously characterized as a benign
hemorrhagic cyst on CT June 28, 2021 requiring no independent
follow-up. No hydronephrosis. Kidneys demonstrate symmetric
enhancement and excretion of contrast material.

Mild symmetric wall thickening of an incompletely distended urinary
bladder.

Stomach/Bowel: Radiopaque enteric contrast material traverses the
rectum. Stomach is unremarkable for degree of distension. No
pathologic dilation of small or large bowel. The appendix and
terminal ileum appear normal. No evidence of acute bowel
inflammation.

Vascular/Lymphatic: Normal caliber abdominal aorta. No
pathologically enlarged abdominal or pelvic lymph nodes.

Reproductive: Status post right orchiectomy with near complete
resolution of the postoperative fluid collection in the right
inguinal canal now with minimal residual fluid for instance on image
86/2.

Other: No significant abdominopelvic free fluid. No discrete
peritoneal or omental nodularity.

Musculoskeletal: No aggressive lytic or blastic lesion of bone.
IMPRESSION: 1. Status post right orchiectomy with near complete resolution of
the postoperative fluid collection in the right inguinal canal.
2. No convincing evidence of metastatic disease within the abdomen
or pelvis.
3. Focal ground-glass area in the right lung base is similar to
prior likely sequela of infection/inflammation. Attention on
follow-up imaging to resolution is suggested.
4. Stable 5 mm hypodense lesion in the posterior right hepatic lobe
previously evaluated on MRI and while technically too small to
accurately characterize demonstrating benign imaging fissures and
stability is most consistent with a benign etiology such as a cyst
or hemangioma. Continued attention on follow-up imaging suggested.
